# Patient Record
Sex: Male | Born: 1960 | Race: Black or African American | Hispanic: No | Smoking: Former smoker
Health system: Southern US, Community
[De-identification: ages and names within clinical notes are randomized; demographics above are authoritative.]

## PROBLEM LIST (undated history)

## (undated) DIAGNOSIS — E079 Disorder of thyroid, unspecified: Secondary | ICD-10-CM

## (undated) DIAGNOSIS — I509 Heart failure, unspecified: Secondary | ICD-10-CM

## (undated) DIAGNOSIS — I10 Essential (primary) hypertension: Secondary | ICD-10-CM

## (undated) HISTORY — PX: PACEMAKER IMPLANT: EP1218

## (undated) HISTORY — PX: CARDIAC DEFIBRILLATOR PLACEMENT: SHX171

---

## 2001-10-12 ENCOUNTER — Emergency Department (HOSPITAL_COMMUNITY): Admission: EM | Admit: 2001-10-12 | Discharge: 2001-10-12 | Payer: Self-pay | Admitting: Emergency Medicine

## 2002-10-22 ENCOUNTER — Emergency Department (HOSPITAL_COMMUNITY): Admission: EM | Admit: 2002-10-22 | Discharge: 2002-10-22 | Payer: Self-pay | Admitting: Emergency Medicine

## 2002-12-27 ENCOUNTER — Inpatient Hospital Stay (HOSPITAL_COMMUNITY): Admission: EM | Admit: 2002-12-27 | Discharge: 2002-12-29 | Payer: Self-pay | Admitting: *Deleted

## 2002-12-27 ENCOUNTER — Encounter: Payer: Self-pay | Admitting: Emergency Medicine

## 2002-12-28 ENCOUNTER — Encounter: Payer: Self-pay | Admitting: Internal Medicine

## 2002-12-28 ENCOUNTER — Encounter (INDEPENDENT_AMBULATORY_CARE_PROVIDER_SITE_OTHER): Payer: Self-pay | Admitting: Cardiology

## 2002-12-29 ENCOUNTER — Encounter: Payer: Self-pay | Admitting: Internal Medicine

## 2003-01-04 ENCOUNTER — Encounter: Admission: RE | Admit: 2003-01-04 | Discharge: 2003-01-04 | Payer: Self-pay | Admitting: Internal Medicine

## 2003-01-13 ENCOUNTER — Encounter: Admission: RE | Admit: 2003-01-13 | Discharge: 2003-01-13 | Payer: Self-pay | Admitting: Internal Medicine

## 2003-01-17 ENCOUNTER — Encounter: Admission: RE | Admit: 2003-01-17 | Discharge: 2003-01-17 | Payer: Self-pay | Admitting: Internal Medicine

## 2003-02-09 ENCOUNTER — Encounter: Payer: Self-pay | Admitting: Emergency Medicine

## 2003-02-09 ENCOUNTER — Emergency Department (HOSPITAL_COMMUNITY): Admission: AD | Admit: 2003-02-09 | Discharge: 2003-02-09 | Payer: Self-pay | Admitting: Emergency Medicine

## 2003-02-16 ENCOUNTER — Encounter: Admission: RE | Admit: 2003-02-16 | Discharge: 2003-02-16 | Payer: Self-pay | Admitting: Internal Medicine

## 2003-03-15 ENCOUNTER — Encounter: Admission: RE | Admit: 2003-03-15 | Discharge: 2003-03-15 | Payer: Self-pay | Admitting: Internal Medicine

## 2003-04-01 ENCOUNTER — Encounter: Admission: RE | Admit: 2003-04-01 | Discharge: 2003-04-01 | Payer: Self-pay | Admitting: Internal Medicine

## 2003-04-10 ENCOUNTER — Encounter: Payer: Self-pay | Admitting: Emergency Medicine

## 2003-04-10 ENCOUNTER — Emergency Department (HOSPITAL_COMMUNITY): Admission: EM | Admit: 2003-04-10 | Discharge: 2003-04-10 | Payer: Self-pay | Admitting: Emergency Medicine

## 2003-04-12 ENCOUNTER — Encounter: Admission: RE | Admit: 2003-04-12 | Discharge: 2003-04-12 | Payer: Self-pay | Admitting: Internal Medicine

## 2003-05-06 ENCOUNTER — Encounter: Admission: RE | Admit: 2003-05-06 | Discharge: 2003-05-06 | Payer: Self-pay | Admitting: Internal Medicine

## 2003-06-29 ENCOUNTER — Encounter: Admission: RE | Admit: 2003-06-29 | Discharge: 2003-06-29 | Payer: Self-pay | Admitting: Internal Medicine

## 2003-06-29 ENCOUNTER — Ambulatory Visit (HOSPITAL_COMMUNITY): Admission: RE | Admit: 2003-06-29 | Discharge: 2003-06-29 | Payer: Self-pay | Admitting: Internal Medicine

## 2003-07-06 ENCOUNTER — Encounter: Admission: RE | Admit: 2003-07-06 | Discharge: 2003-07-06 | Payer: Self-pay | Admitting: Internal Medicine

## 2003-08-09 ENCOUNTER — Encounter: Admission: RE | Admit: 2003-08-09 | Discharge: 2003-08-09 | Payer: Self-pay | Admitting: Internal Medicine

## 2003-09-22 ENCOUNTER — Encounter: Admission: RE | Admit: 2003-09-22 | Discharge: 2003-09-22 | Payer: Self-pay | Admitting: Internal Medicine

## 2003-09-22 ENCOUNTER — Ambulatory Visit (HOSPITAL_COMMUNITY): Admission: RE | Admit: 2003-09-22 | Discharge: 2003-09-22 | Payer: Self-pay | Admitting: Internal Medicine

## 2003-10-19 ENCOUNTER — Encounter: Admission: RE | Admit: 2003-10-19 | Discharge: 2003-10-19 | Payer: Self-pay | Admitting: Internal Medicine

## 2003-10-25 ENCOUNTER — Encounter: Admission: RE | Admit: 2003-10-25 | Discharge: 2003-10-25 | Payer: Self-pay | Admitting: Internal Medicine

## 2003-12-29 ENCOUNTER — Encounter: Admission: RE | Admit: 2003-12-29 | Discharge: 2003-12-29 | Payer: Self-pay | Admitting: Internal Medicine

## 2004-03-27 ENCOUNTER — Emergency Department (HOSPITAL_COMMUNITY): Admission: EM | Admit: 2004-03-27 | Discharge: 2004-03-27 | Payer: Self-pay | Admitting: Emergency Medicine

## 2004-03-30 ENCOUNTER — Encounter: Admission: RE | Admit: 2004-03-30 | Discharge: 2004-03-30 | Payer: Self-pay | Admitting: Internal Medicine

## 2004-07-13 ENCOUNTER — Emergency Department (HOSPITAL_COMMUNITY): Admission: EM | Admit: 2004-07-13 | Discharge: 2004-07-13 | Payer: Self-pay | Admitting: Emergency Medicine

## 2004-08-01 ENCOUNTER — Ambulatory Visit: Payer: Self-pay | Admitting: Internal Medicine

## 2004-08-02 ENCOUNTER — Ambulatory Visit (HOSPITAL_COMMUNITY): Admission: RE | Admit: 2004-08-02 | Discharge: 2004-08-02 | Payer: Self-pay | Admitting: Internal Medicine

## 2004-08-03 ENCOUNTER — Ambulatory Visit (HOSPITAL_COMMUNITY): Admission: RE | Admit: 2004-08-03 | Discharge: 2004-08-03 | Payer: Self-pay | Admitting: Internal Medicine

## 2004-08-03 ENCOUNTER — Ambulatory Visit: Payer: Self-pay | Admitting: Internal Medicine

## 2004-11-06 ENCOUNTER — Inpatient Hospital Stay (HOSPITAL_COMMUNITY): Admission: EM | Admit: 2004-11-06 | Discharge: 2004-11-13 | Payer: Self-pay | Admitting: Emergency Medicine

## 2004-11-06 ENCOUNTER — Ambulatory Visit: Payer: Self-pay | Admitting: Internal Medicine

## 2004-11-06 ENCOUNTER — Ambulatory Visit: Payer: Self-pay | Admitting: Cardiology

## 2004-11-09 ENCOUNTER — Encounter: Payer: Self-pay | Admitting: Internal Medicine

## 2004-11-20 ENCOUNTER — Ambulatory Visit: Payer: Self-pay | Admitting: Cardiology

## 2004-12-12 ENCOUNTER — Ambulatory Visit: Payer: Self-pay | Admitting: Internal Medicine

## 2004-12-18 ENCOUNTER — Ambulatory Visit: Payer: Self-pay | Admitting: Internal Medicine

## 2005-02-07 ENCOUNTER — Emergency Department (HOSPITAL_COMMUNITY): Admission: EM | Admit: 2005-02-07 | Discharge: 2005-02-07 | Payer: Self-pay | Admitting: Emergency Medicine

## 2005-02-21 ENCOUNTER — Emergency Department (HOSPITAL_COMMUNITY): Admission: EM | Admit: 2005-02-21 | Discharge: 2005-02-21 | Payer: Self-pay | Admitting: Emergency Medicine

## 2005-04-01 ENCOUNTER — Emergency Department (HOSPITAL_COMMUNITY): Admission: EM | Admit: 2005-04-01 | Discharge: 2005-04-01 | Payer: Self-pay | Admitting: *Deleted

## 2005-07-24 ENCOUNTER — Emergency Department (HOSPITAL_COMMUNITY): Admission: EM | Admit: 2005-07-24 | Discharge: 2005-07-24 | Payer: Self-pay | Admitting: Emergency Medicine

## 2005-07-31 ENCOUNTER — Emergency Department (HOSPITAL_COMMUNITY): Admission: EM | Admit: 2005-07-31 | Discharge: 2005-07-31 | Payer: Self-pay | Admitting: Emergency Medicine

## 2005-07-31 ENCOUNTER — Ambulatory Visit: Payer: Self-pay | Admitting: Cardiology

## 2005-08-02 ENCOUNTER — Ambulatory Visit: Payer: Self-pay | Admitting: Cardiology

## 2005-08-23 ENCOUNTER — Ambulatory Visit: Payer: Self-pay | Admitting: Cardiology

## 2005-09-17 ENCOUNTER — Ambulatory Visit: Payer: Self-pay | Admitting: Cardiology

## 2005-10-03 ENCOUNTER — Ambulatory Visit: Payer: Self-pay | Admitting: Internal Medicine

## 2005-10-10 ENCOUNTER — Ambulatory Visit (HOSPITAL_COMMUNITY): Admission: RE | Admit: 2005-10-10 | Discharge: 2005-10-10 | Payer: Self-pay | Admitting: Internal Medicine

## 2005-10-10 ENCOUNTER — Ambulatory Visit: Payer: Self-pay | Admitting: Internal Medicine

## 2005-11-07 ENCOUNTER — Emergency Department (HOSPITAL_COMMUNITY): Admission: EM | Admit: 2005-11-07 | Discharge: 2005-11-07 | Payer: Self-pay | Admitting: Nurse Practitioner

## 2005-12-16 ENCOUNTER — Emergency Department (HOSPITAL_COMMUNITY): Admission: EM | Admit: 2005-12-16 | Discharge: 2005-12-16 | Payer: Self-pay | Admitting: Emergency Medicine

## 2005-12-19 ENCOUNTER — Ambulatory Visit: Payer: Self-pay | Admitting: Internal Medicine

## 2005-12-30 ENCOUNTER — Emergency Department (HOSPITAL_COMMUNITY): Admission: EM | Admit: 2005-12-30 | Discharge: 2005-12-30 | Payer: Self-pay | Admitting: Emergency Medicine

## 2006-05-16 ENCOUNTER — Emergency Department (HOSPITAL_COMMUNITY): Admission: EM | Admit: 2006-05-16 | Discharge: 2006-05-16 | Payer: Self-pay | Admitting: Emergency Medicine

## 2006-06-13 ENCOUNTER — Ambulatory Visit: Payer: Self-pay | Admitting: Family Medicine

## 2006-06-19 ENCOUNTER — Ambulatory Visit: Payer: Self-pay | Admitting: Family Medicine

## 2006-08-08 ENCOUNTER — Emergency Department (HOSPITAL_COMMUNITY): Admission: EM | Admit: 2006-08-08 | Discharge: 2006-08-08 | Payer: Self-pay | Admitting: Family Medicine

## 2006-08-12 DIAGNOSIS — F1010 Alcohol abuse, uncomplicated: Secondary | ICD-10-CM | POA: Insufficient documentation

## 2006-08-12 DIAGNOSIS — I428 Other cardiomyopathies: Secondary | ICD-10-CM | POA: Insufficient documentation

## 2006-08-12 DIAGNOSIS — E0500 Thyrotoxicosis with diffuse goiter without thyrotoxic crisis or storm: Secondary | ICD-10-CM | POA: Insufficient documentation

## 2006-08-27 ENCOUNTER — Ambulatory Visit: Payer: Self-pay | Admitting: Family Medicine

## 2006-09-01 DIAGNOSIS — Z9889 Other specified postprocedural states: Secondary | ICD-10-CM | POA: Insufficient documentation

## 2006-09-01 DIAGNOSIS — K219 Gastro-esophageal reflux disease without esophagitis: Secondary | ICD-10-CM | POA: Insufficient documentation

## 2006-09-01 DIAGNOSIS — I509 Heart failure, unspecified: Secondary | ICD-10-CM | POA: Insufficient documentation

## 2006-09-01 DIAGNOSIS — E038 Other specified hypothyroidism: Secondary | ICD-10-CM | POA: Insufficient documentation

## 2006-10-20 ENCOUNTER — Emergency Department (HOSPITAL_COMMUNITY): Admission: EM | Admit: 2006-10-20 | Discharge: 2006-10-20 | Payer: Self-pay | Admitting: Emergency Medicine

## 2006-10-20 ENCOUNTER — Ambulatory Visit: Payer: Self-pay | Admitting: Cardiovascular Disease

## 2006-10-27 ENCOUNTER — Ambulatory Visit: Payer: Self-pay | Admitting: Family Medicine

## 2007-07-11 ENCOUNTER — Emergency Department (HOSPITAL_COMMUNITY): Admission: EM | Admit: 2007-07-11 | Discharge: 2007-07-11 | Payer: Self-pay | Admitting: Emergency Medicine

## 2008-01-27 ENCOUNTER — Ambulatory Visit: Payer: Self-pay | Admitting: Family Medicine

## 2009-02-06 ENCOUNTER — Telehealth (INDEPENDENT_AMBULATORY_CARE_PROVIDER_SITE_OTHER): Payer: Self-pay | Admitting: *Deleted

## 2009-02-27 ENCOUNTER — Emergency Department (HOSPITAL_COMMUNITY): Admission: EM | Admit: 2009-02-27 | Discharge: 2009-02-28 | Payer: Self-pay | Admitting: Emergency Medicine

## 2009-11-13 IMAGING — CR DG CHEST 2V
2 series · 2 of 2 positions shown · non-contrast
Comparison: none

HISTORY: Chest pain

CHEST 2 VIEWS:
Comparison made to prior study of 10/20/2006
Mild cardiac enlargement.
Tortuous aorta.
Pulmonary vascularity normal.
Minimal chronic bronchitic changes.
No infiltrate, pleural effusion, or pneumothorax.
Bones unremarkable

[view not recorded (1 of 2)]
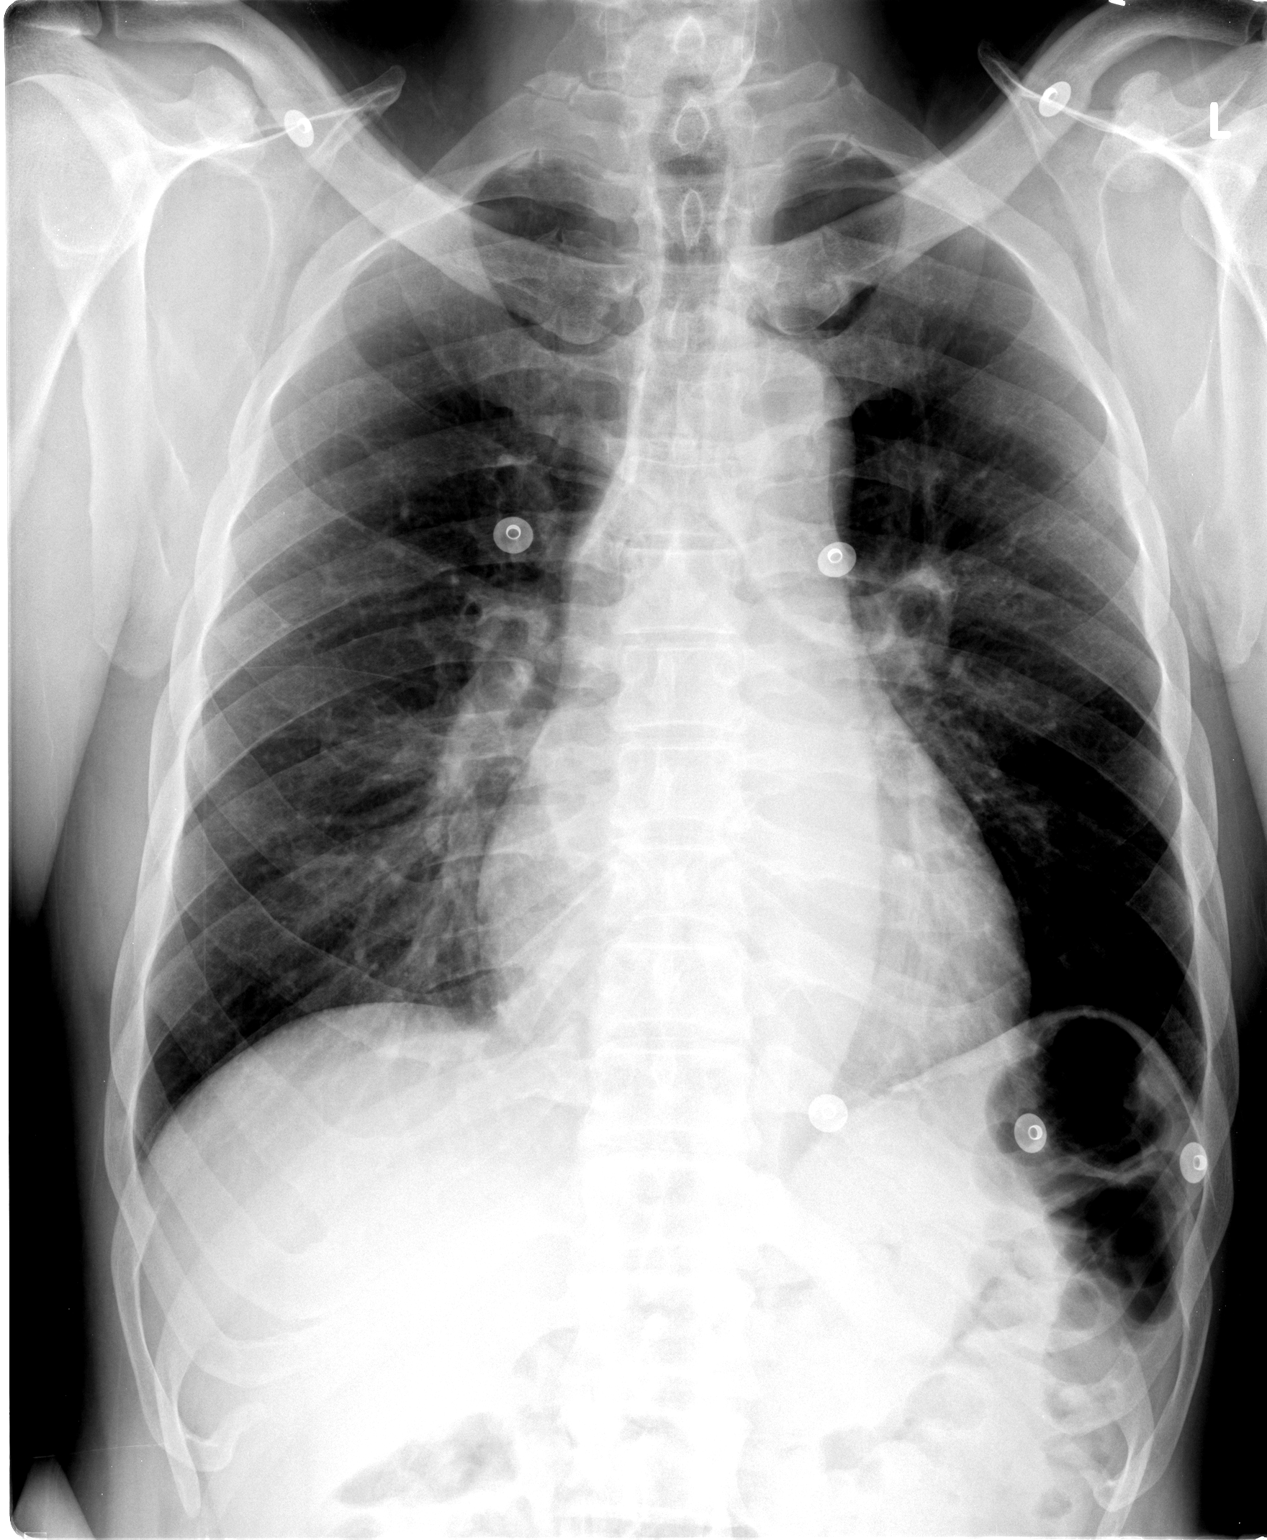

[view not recorded (2 of 2)]
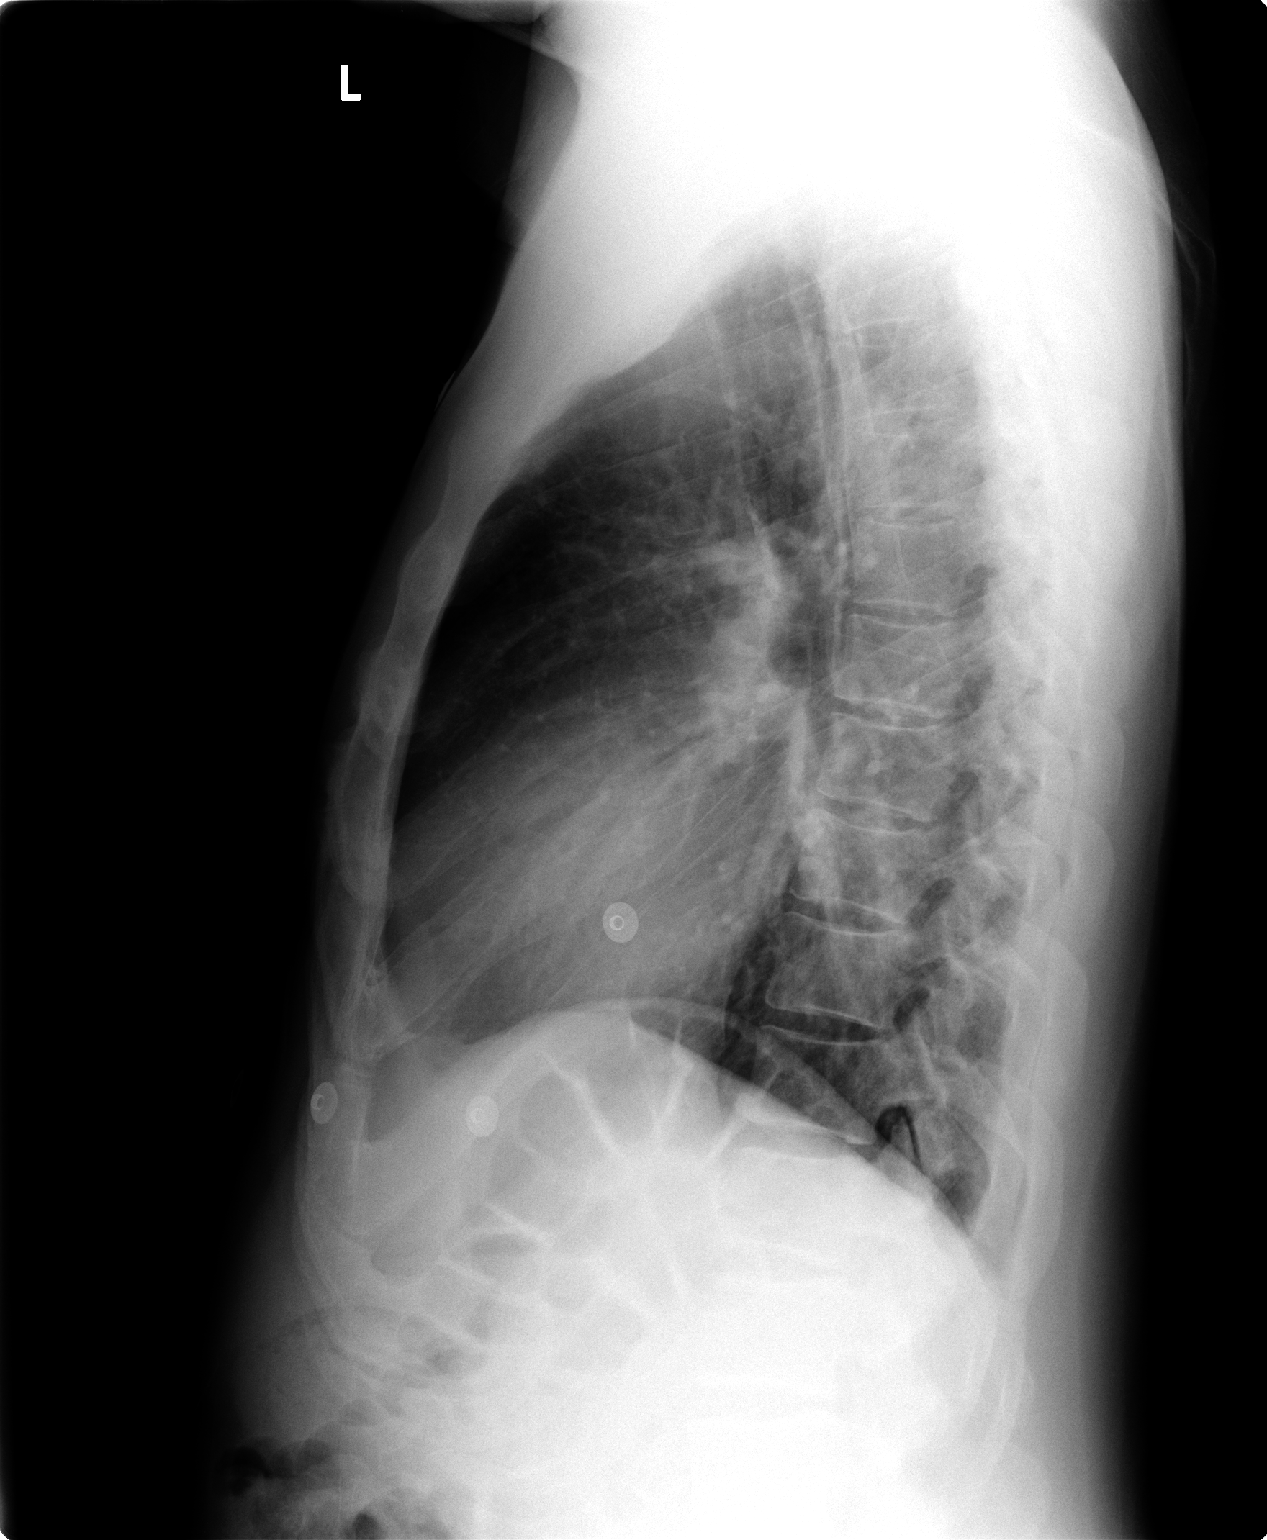

[2 of 2 positions shown; findings below may reference images not displayed]

IMPRESSION: Cardiomegaly with minimal chronic bronchitic changes.
No acute abnormalities.

## 2010-09-16 ENCOUNTER — Encounter: Payer: Self-pay | Admitting: Internal Medicine

## 2010-12-02 LAB — BASIC METABOLIC PANEL
BUN: 11 mg/dL (ref 6–23)
CO2: 25 mEq/L (ref 19–32)
Calcium: 9 mg/dL (ref 8.4–10.5)
Chloride: 103 mEq/L (ref 96–112)
Creatinine, Ser: 1.06 mg/dL (ref 0.4–1.5)
GFR calc Af Amer: >60 mL/min (ref >60)
GFR calc non Af Amer: >60 mL/min (ref >60)
Glucose, Bld: 100 mg/dL — ABNORMAL HIGH (ref 70–99)
Potassium: 4.1 mEq/L (ref 3.5–5.1)
Sodium: 135 mEq/L (ref 135–145)

## 2010-12-02 LAB — POCT CARDIAC MARKERS
CKMB, poc: <1 ng/mL — ABNORMAL LOW (ref 1.0–8.0)
Myoglobin, poc: 53.7 ng/mL (ref 12–200)
Troponin i, poc: <0.05 ng/mL (ref 0.00–0.09)

## 2010-12-02 LAB — CBC
HCT: 42.5 % (ref 39.0–52.0)
Hemoglobin: 13.9 g/dL (ref 13.0–17.0)
MCHC: 32.8 g/dL (ref 30.0–36.0)
MCV: 74.2 fL — ABNORMAL LOW (ref 78.0–100.0)
Platelets: 152 10*3/uL (ref 150–400)
RBC: 5.73 MIL/uL (ref 4.22–5.81)
RDW: 14.2 % (ref 11.5–15.5)
WBC: 4.7 10*3/uL (ref 4.0–10.5)

## 2010-12-02 LAB — DIFFERENTIAL
Basophils Absolute: 0 10*3/uL (ref 0.0–0.1)
Basophils Relative: 1 % (ref 0–1)
Eosinophils Absolute: 0.1 10*3/uL (ref 0.0–0.7)
Eosinophils Relative: 3 % (ref 0–5)
Lymphocytes Relative: 21 % (ref 12–46)
Lymphs Abs: 1 10*3/uL (ref 0.7–4.0)
Monocytes Absolute: 0.3 10*3/uL (ref 0.1–1.0)
Monocytes Relative: 7 % (ref 3–12)
Neutro Abs: 3.2 10*3/uL (ref 1.7–7.7)
Neutrophils Relative %: 68 % (ref 43–77)

## 2011-01-11 NOTE — Discharge Summary (Signed)
[name], [name]                             ACCOUNT NO.:  [id number]   MEDICAL RECORD NO.:  [id number]                   PATIENT TYPE:  INP   LOCATION:  3734                                 FACILITY:  [facility]   PHYSICIAN:  [name] [name], M.D.                  DATE OF BIRTH:  Dec 20, 1960   DATE OF ADMISSION:  12/27/2002  DATE OF DISCHARGE:  12/29/2002                                 DISCHARGE SUMMARY   DISCHARGE DIAGNOSES:  1. 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  2. Question hyperthyroidism.  3. Microcytosis.   DISCHARGE MEDICATIONS:  1. Aspirin 325 mg p.o. daily.  2. Atenolol 50 mg p.o. daily.   FOLLOW UP:  The patient is to follow up at [facility] on Thursday,  Dec 30, 2002, at 11 o'clock for an RAI/uptake scan to evaluate his thyroid.  He is also to follow up with Oretha Ellis, M.D. on Tuesday, May 11, at 3  p.m. at [facility] The phone number is [phone].  The  patient is given all of this information.   HISTORY OF PRESENT ILLNESS:  The patient is a 50 year old male with a  questionable history of hyperthyroidism, who presents with a two-week  history of cough, worse when lying down at night. He feels as though the  cough came on with cold-like symptoms approximately two weeks ago. Since  then the cold had improved, but the cough remained. He reports that he is  coughing up a brownish-yellowish sputum. He has positive sore throat, no  congestion, and no eye problems.  He says that his mouth remains dry and no  fevers. He is short of breath at night when he starts coughing.  The patient  reports that he is able to go about his daily activity without feeling short  of breath. Denies sick contacts, denies TB exposure.  He has a hoarse voice.   PAST MEDICAL HISTORY:  Question diagnosis of hyperthyroidism.  Hyperlipidemia.  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   PHYSICAL EXAMINATION:  VITAL SIGNS:  Pulse 83, blood  pressure 131/87,  temperature 98.0, respirations 20, 98% on room air.  GENERAL:  No acute distress, intermittent cough.  HEENT:  Oropharynx erythematous with cobblestone appearance.  NECK:  No thyromegaly, mild lymphadenopathy bilaterally.  LUNGS:  Respirations clear to auscultation bilaterally.  Fair inspiratory  effort. No crackles.  HEART:  Regular rate and rhythm.  No murmurs, no rubs, S3.  Good pulses.  ABDOMEN:  Bowel sounds positive.  Soft, nontender, and nondistended.  SKIN: No rashes.  EXTREMITIES:  No tremor.  NEUROLOGY:  Cranial nerves II-XII grossly intact.  2+ patellar reflexes.   Admission chest x-ray showed mildly enlarged heart, peribronchial  thickening, no focal air space opacities or effusions.  Impression;  bronchitic changes.   EKG; normal sinus rhythm, biatrial enlargement, left ventricular  hypertrophy.  There was an inverted T wave in V4 and V5.   LABORATORY DATA:  Admission BNP 276.  Admission CBC; hemoglobin 13.9, white  blood cell count 6.6, 12% eosinophils, MCV 69.7.   HOSPITAL COURSE:  Problem 1.  Cardiomegaly. The patient underwent an  echocardiogram that showed ejection fraction 10 to 20%. Echo pattern  consistent with dilated cardiomyopathy. No diagnostic evidence of regional  wall abnormalities.  Left atrium was moderately dilated.  Right ventricle  was mild to moderately dilated.  This cardiomyopathy is not likely ischemic  as the patient ruled out by cardiac enzymes. Apparently, the patient has had  a workup for cardiomyopathy over a year ago in [facility]  Records of this are being sent to [facility].  Most likely  etiology includes hyperthyroidism. The patient has a history of  hyperthyroidism.  Apparently, was told that he needed to be treated with  radiation, but did not undergo the treatment.  His mother was hypothyroid by  history. The patient reports that he generally runs hot.  His TSH was  0.009.  Free T4  is pending.  The patient was started on a beta blocker and  is being sent out on such. Other possible etiologies include viral including  HIV and HIV test is pending. The patient will need to get his thyroid under  control and have close outpatient management of his heart failure.   Problem 2.  Hyperthyroidism.  The patient had a presumed diagnosis of  hyperthyroidism by history and lab, however, the patient said that he may  have been off his Synthroid at one point, hence the question of the actual  diagnosis.  Free T4 is pending. The patient is scheduled for an RAI and  uptake in the morning. We will follow up his labs and treat him  appropriately when the official diagnosis has been made.   Problem 3.  Cough.  This could be related to allergies, the patient having  eosinophilia, or to asthma or perhaps to a mild degree of congestive heart  failure.  It has resolved during this hospitalization.  We will continue to  monitor this in the outpatient setting.   Problem 4.  Microcytosis.  The patient is not anemic.  His discharge  hemoglobin is 14.5, however, his MCV is 69.3.   FOLLOW UP:  The patient is to follow up with radiology as mentioned above  and with Oretha Ellis, M.D. at [facility] on Tuesday,  May 11, at which point we will need to make sure that he is on correct  medications for his cardiomyopathy, begin appropriate treatment depending on  the final results of his free T4 and his RAI/uptake and continued general  health maintenance.     Oretha Ellis, M.D.                      [name] [name], M.D.    BT/MEDQ  D:  12/29/2002  T:  12/31/2002  Job:  [id number]   cc:   [facility]

## 2011-01-11 NOTE — Consult Note (Signed)
[name], [name]                 ACCOUNT NO.:  [id number]   MEDICAL RECORD NO.:  [id number]          PATIENT TYPE:  INP   LOCATION:  4703                         FACILITY:  [facility]   PHYSICIAN:  Jonelle Sidle, M.D. LHCDATE OF BIRTH:  [birth year]   DATE OF CONSULTATION:  11/07/2004  DATE OF DISCHARGE:                                   CONSULTATION   REASON FOR CONSULTATION:  Chest pain and cardiomyopathy.   HISTORY OF PRESENT ILLNESS:  [name] [name] is a 50 year old male with a history  of hypothyroidism, medication noncompliance, prior and current substance  abuse, possible dyslipidemia, and hypertension, and a cardiomyopathy with an  ejection fraction in the range of 10% to 20%, associated with left  ventricular enlargement as well as right ventricular enlargement and  dysfunction by echocardiogram in May of 2004.  The presumption is that this  was felt to be nonischemic, although I am not certain that the patient has  ever undergone any specific ischemic testing.  He tells me that he had  several heart tests done at [facility] in [place] a few years ago.  There is also some mention of this in a previous discharge summary, although  I see no specifics at this time.  In any event, he is now admitted to the  hospital with a 2-3 day history of waxing and waning chest discomfort with  some numbness in his left arm and hand.  His cardiac markers during  observation have been normal, and his electrocardiogram does show inferior  and anterolateral ST-T wave changes, specifically with T wave inversions  that look to be more prominent compared to previous tracing from December of  2005.  Today, on interview, he is chest pain free.  Plans were originally to  proceed with a Cardiolite study for the assessment of ischemia.  We have  been asked to offer our opinion in this regard.   ALLERGIES:  No reported drug allergies.   MEDICATIONS:  At home, the patient was apparently to be taking  -  1.  Lasix 40 mg p.o. daily.  2.  Lisinopril 20 mg p.o. daily.  3.  Some dose of Synthroid.  (He tells me that he has not been taking Synthroid for the last few months,  and has been inconsistent with the other medications.)   PAST MEDICAL HISTORY:  1.  Dilated cardiomyopathy with an ejection fraction in the 10% to 20%      range.  This is described as being potentially non-ischemic, although I      am not certain about previous ischemic testing.  He is not aware of any      diagnosis of coronary artery disease or myocardial infarction.  2.  Possible dyslipidemia and hypertension.  3.  Hypothyroidism.  He has not been consistent with thyroid replacement      medicines.  His present TSH is 14.  4.  History of nephrolithiasis.  5.  Status post previous hemorrhoidectomy.  6.  Apparent problems with gastroparesis.   SOCIAL HISTORY:  The patient is separated.  He reports a  history of  marijuana use recently.  He has a remote history of cocaine use, although  has apparently abstained for the last 6 years, and was in rehabilitation at  one point.  He drinks 2 beers a day.  At present, he is out of work.   FAMILY HISTORY:  Significant for hypertension, type 2 diabetes mellitus, and  coronary artery disease.   REVIEW OF SYSTEMS:  As described in the history of present illness.  He has  had some problems with nausea after meals.  He has had some reported cold  intolerance, back pain, dizziness, depressed mood, and chest pain as  described.  He otherwise denies having any active orthopnea, progressive  shortness of breath with exertion, lower extremity edema, palpitations, and  syncope.   PHYSICAL EXAMINATION:  VITAL SIGNS:  Temperature is 97.3 degrees, heart rate  55 and sinus rhythm, blood pressure 104/77, oxygen saturation 100% on room  air.  GENERAL:  This is a thin male lying supine, in no acute respiratory  distress.  NECK:  No elevated jugular venous pressure or loud carotid  bruits.  LUNGS:  Essentially clear to auscultation with somewhat decreased breath  sounds at the bases.  CARDIAC:  Regular rate and rhythm with a soft S4 and apical systolic murmur.  No pericardial rub is noted.  ABDOMEN:  Soft and nondistended.  EXTREMITIES:  No pitting edema.  Peripheral pulses are 2+.   LABORATORY DATA:  Hemoglobin 14.5, hematocrit 44.7, platelets 144, WBC's  4.5.  Potassium 3.6, BUN 11, creatinine 1.2, glucose 124.  TSH is 14.  Troponin I levels are 0.01 times 3.  BNP is less than 30.  Drug screen is  positive for tetrahydrocannabinol.   Chest x-ray from November 06, 2004 is reported as showing cardiomegaly with no  active disease.   IMPRESSION:  1.  Dilated cardiomyopathy with biventricular dysfunction by echocardiogram      in May of 2004.  The presumption is a nonischemic cardiomyopathy,      although I am not certain that this has been objectively documented.      The patient apparently had some type of cardiac evaluation at [facility] in [place] a few years ago.  He tells me that this may have      included cardiac catheterization based on his description.  At this      point, he has presented with chest discomfort with somewhat atypical      features, and has ruled out for myocardial infarction.  He is not      particularly volume overloaded at this time, and there is a repeat      echocardiogram pending for reassessment of left ventricular function.      He has been tentatively scheduled for a Cardiolite by his primary team.  2.  History of polysubstance abuse and medication noncompliance, as      outlined.  3.  History of hypothyroidism with inconsistent use of Synthroid.  4.  Possible history of hypertension and dyslipidemia.   RECOMMENDATIONS:  1.  Would follow up on echocardiogram, and, if possible, try to obtain      previous cardiac records from [facility] in [place]. 2.  Agree with ACE inhibitor and diuretic for the time being.   Perhaps      ultimately a low-dose beta blocker could be added, particularly if his      heart rate improves somewhat with resumption of therapy for  hypothyroidism.  An aspirin would also not be unreasonable until the      issue of ischemia is clarified.  3.  In terms of further diagnostic cardiac testing, it may actually be best      to consider a right and left heart catheterization for both hemodynamic      assessment and clear definition of his coronary anatomy, particularly if      this has not been already assessed in the past.  If, in fact, he has      already undergone cardiac catheterization demonstrating normal coronary      arteries, then a Cardiolite would be reasonable at this time.  4.  Obviously of greater importance will be compliance and follow up in      terms of this patient's prognosis.  Would      anticipate stabilizing him on medical therapy, and if, in fact, he has      not undergone cardiac catheterization in the past, anticipate this at      some point during this hospital stay.   Will plan to follow with you.      SGM/MEDQ  D:  11/07/2004  T:  11/07/2004  Job:  [id number]

## 2011-01-11 NOTE — Discharge Summary (Signed)
[name], [name] [name]                 ACCOUNT NO.:  [id number]   MEDICAL RECORD NO.:  [id number]          PATIENT TYPE:  INP   LOCATION:  4703                         FACILITY:  [facility]   PHYSICIAN:  Donald Pore, MD       DATE OF BIRTH:  September 08, 1960   DATE OF ADMISSION:  11/06/2004  DATE OF DISCHARGE:                                 DISCHARGE SUMMARY   DISCHARGE DIAGNOSES:  1.  Nonischemic cardiomyopathy.  2.  Hypothyroidism status post radioactive ablation.  3.  History of nephrolithiasis.  4.  Status post previous hemorrhoidectomy.  5.  Hypertension.  6.  Gastroparesis.   DISCHARGE MEDICATIONS:  1.  Lasix 20 mg p.o. daily.  2.  Lisinopril 10 mg p.o. daily.  3.  Protonix 40 mg p.o. daily.  4.  Reglan 5 mg p.o. daily before meal in evening.  5.  Nitroglycerin 0.4 mg sublingual q.5 minutes for a maximum three doses      p.r.n. chest pain.  6.  Thiamine 100 mg p.o. daily.  7.  Folate 1 mg p.o. daily.  8.  Synthroid 100 mcg p.o. daily.  9.  Ecotrin 325 mg p.o. daily.   DISCHARGE INSTRUCTIONS:  No driving x2 days.  No lifting over 10 pounds x1  week.  Clean catheterization site with soap and water.  No tub bathing or  submerging x1 week.   FOLLOW-UP APPOINTMENTS:  Hospital follow-up appointment with Dr. Donald Pore at the [facility] on November 28, 2004 at 4 p.m.  Hospital follow-up appointment at the Heart Failure Clinic at [facility] on Tuesday, March 28 at 11 a.m.  Follow-up appointment with Dr.  Diona Browner on April 11 at 9 a.m. at [facility].   CONSULTS:  1.  Dr. Nona Dell  2.  Dr. Keitha Butte  3.  Dr. Duncan Dull   STUDIES:  1.  The patient underwent a 2-D echocardiogram on November 09, 2004 showing a      left ventricular ejection fraction of 25% with diffuse global      hypokinesis, moderately dilated left ventricles, right ventricular      systolic function mildly reduced.  2.  Cardiac catheterization was performed on November 13, 2004.  Showed      nonischemic dilated cardiomyopathy with normal coronary arteries, no      significant coronary artery disease.  3.  On November 08, 2004 patient underwent a Cardiolite showing an ejection      fraction of 29% with a probable inferior infarct with mild ischemia in      the inferior apical segment and marked left ventricular enlargement,      global hypokinesis most prominent in the inferior wall consistent with a      cardiomyopathy.   HISTORY OF PRESENT ILLNESS:  This is a 50 year old male who reported to the  emergency room complaining of a two-day history of left-sided chest pain  worsening with exertion (patient stated chest pain acutely worsened while  walking [place] the cour[place]).  Patient also complained of left  hand numbness.  He denied chills, cough, hemoptysis, fever.  On admission  the patient reported that he had not taken his Synthroid for the past three  months.  He thought that he had been taking it, but later realized that he  did not have the medication on-hand.   HOSPITAL COURSE:  #1 - CHEST PAIN:  Complete work-up for his chest pain was  performed and patient was ruled out for an acute coronary syndrome.  Serial  cardiac enzymes were negative.  No pneumonia or aortic dissection was  visualized on chest x-ray.  D-dimer was less than 0.22 so it was felt  unlikely that patient was experiencing symptoms related to a PE.  2-D  echocardiogram was performed with the aforementioned results.  In light of  the results of his Cardiolite and without having any records from his prior  work-up in [place], we felt that it was in the patient's best interest  that ischemic causes for his cardiomyopathy be ruled out so he underwent a  cardiac catheterization with the aforementioned results.  Patient tolerated  the procedure well and on the day of discharge was stable without  complications.  He is to follow up at the [facility] Failure Clinic and also at   [facility] as previously dictated.   #2 - HYPOTHYROIDISM:  On admission Mr. [name] [name]' TSH was 14.04.  He was  restarted on his Synthroid at 100 mcg daily.  TSH will need to be rechecked  on outpatient basis.  This can be done at his hospital follow-up appointment  at the [facility].   #3 - HYPERTENSION:  Mr. [name] [name]' blood pressures were well controlled  throughout his stay.  In fact, he did experience some episodes of systolic  blood pressures in the 90s.  Thus, his Lasix dose was decreased from 40 mg  p.o. daily to 20 mg p.o. daily upon discharge.   #4 - GASTROPARESIS:  Patient complained of symptoms of nausea following  meals.  Patient was started on Protonix and Reglan during his  hospitalization with some improvement in his symptoms.  He will need to be  followed by his primary care physician to see if his symptoms have improved  or if any adjustment in medication needs to be made.   #5 - ALCOHOL USE:  Patient was started on vitamin B and folic acid.  He  drinks two beers per day.   The patient was discharged to home in stable condition with the  aforementioned follow-up appointments.  On the day of discharge his BMET was  as follows:  Sodium 132, potassium 4.1, chloride 97, CO2 27, BUN 12,  creatinine 1.1, calcium 9.3, glucose 88.  CBC was as follows:  White count  6.6, hemoglobin 14.4, hematocrit 44.5, and platelet count of 207.     HP/MEDQ  D:  11/13/2004  T:  11/13/2004  Job:  [id number]   cc:   Jonelle Sidle, M.D. [facility]   Keitha Butte, MD  Fax: [phone]

## 2011-01-11 NOTE — Discharge Summary (Signed)
[name] [name], [name] [name]                 ACCOUNT NO.:  [id number]   MEDICAL RECORD NO.:  [id number]          PATIENT TYPE:  EMS   LOCATION:  MAJO                         FACILITY:  [facility]   PHYSICIAN:  Rollene Rotunda, M.D.   DATE OF BIRTH:  [birth year]   DATE OF ADMISSION:  07/31/2005  DATE OF DISCHARGE:  07/31/2005                                 DISCHARGE SUMMARY   AMA DISCHARGE.   HISTORY OF PRESENT ILLNESS:  [name] [name] admitted, see H and P for details.  Currently pain-free.  He states that he has changed his mind and he does not  want to stay in hospital overnight for observation and that he is going to  just leave the hospital and keep his appointment, which he has at our  office.  I called to clarify appointment with physician extender on Friday,  August 02, 2005 at 4:00 P.M.  [name] [name] states that his chest pain is  completely gone.   LABORATORY DATA:  At this time the point of care is negative X2.  Drug  screen negative.  ETOH less than 5.  Other laboratory work as stated in  history and physical.   DISCUSSION:  I spent some time talking with [name] [name] about his  hypertension and the effects on his heart and the need for medical  compliance.  [name] [name] states he is going to try to do better and take his  medication as prescribed and assures me he will follow up with his  appointment on Friday.  I have given him a prescription for his Lisinopril  and Lasix as he has none left at home and I do not want him to go without.   Duration of discharge AMA encounter 20 minutes.      Dorian Pod, NP    ______________________________  Rollene Rotunda, M.D.    MB/MEDQ  D:  07/31/2005  T:  07/31/2005  Job:  [id number]   cc:   Rollene Rotunda, M.D.  [address]  [address]  [place]  [place] [address]   [facility]

## 2011-01-11 NOTE — Consult Note (Signed)
[name] [name], [name] [name]                 ACCOUNT NO.:  [id number]   MEDICAL RECORD NO.:  [id number]          PATIENT TYPE:  EMS   LOCATION:  MAJO                         FACILITY:  [facility]   PHYSICIAN:  Veverly Fells. Excell Seltzer, MD  DATE OF BIRTH:  March 30, 1961   DATE OF CONSULTATION:  10/20/2006  DATE OF DISCHARGE:                                 CONSULTATION   PRIMARY CARDIOLOGIST:  Rollene Rotunda, MD, [facility], [place] and Dorian Pod,  ACNP.   PRIMARY CARE PHYSICIAN:  Dr. Susann Givens at [facility]  through [place].   HISTORY OF PRESENT ILLNESS:  This is a 50 year old African-American male  with complaints of left-sided chest pain x1 which got worse yesterday on  October 19, 2006.  He describes it as chest pressure, feelings of  weakness when lying down.  The pain was continuous and constant.  He  could not eat secondary to vomiting.  He did not sleep well as a result  of the pressure.  He states when he lies on his left side the pain  becomes worse, so he lies on his right side, and it does become better.  He denies any PND, orthopnea.  He denies any shortness of breath or  edema.   The patient has been seen by Dr. Daiva Nakayama and Dorian Pod, ACNP,  in the past through the [facility] Failure [facility], but he has not been to the  Heart Failure Clinic in over a year.  The patient does see outpatient  clinic physicians through [facility], however, at a family health  clinic.  The patient currently is without discomfort and is able to lie  flat on his evaluation.   PAST MEDICAL HISTORY:  1. 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  2. Hypothyroidism.  3. Polysubstance abuse.  4. Hypertension.  5. Gastroparesis.   SOCIAL HISTORY:  The patient lives in [place] with his girlfriend.  He is a Psychologist, occupational and a Psychologist, occupational.  He denies tobacco.  He does drink one  beer once a day and whiskey on occasion.  He does smoke marijuana about  every other day.  He has used  cocaine in the past.  He is not on any  special diet.  He does not use herbal medications.   FAMILY HISTORY:  Mother died of a myocardial infarction at age 57.  Father is alive, and he has unknown health problems.  He has one brother  with hypertension.   CURRENT MEDICATIONS AT HOME:  1. Synthroid 1 mg once a day.  2. Lasix 20 mg once a day.  3. Metoclopramide 5 mg with meals.   ALLERGIES:  No known drug allergies.   CURRENT LABORATORY DATA:  Sodium 138, potassium 4.0, chloride 104, CO2  12, BUN 1.2, glucose 96, hemoglobin 17.7, hematocrit 52.0, white blood  cells 4.1, platelets 175.  Troponin 0.05, CK-MB 1.0, CK 75.   PHYSICAL EXAMINATION:  VITAL SIGNS:  Blood pressure 135/85, pulse 50,  respirations 18, temperature 98.5, O2 saturation 97% on room air.  HEENT:  Head is normocephalic and atraumatic.  EYES:  PERRLA.  Mucous  membranes of the mouth pink and moist.  Tongue is midline.  NECK:  Supple without JVD, no carotid bruits are appreciated.  CARDIOVASCULAR:  Regular rate and rhythm with a 2/6 apical systolic  murmur auscultated.  LUNGS:  Essentially clear to auscultation.  ABDOMEN:  Soft, nontender, 2+ bowel sounds, no abdominal bruits are  appreciated, no ballotment, and there is no tenderness noted.  EXTREMITIES:  Without clubbing, cyanosis, or edema.  Dorsalis pedis  pulses 1+ bilaterally.  SKIN:  Warm and dry.  NEUROLOGIC:  Intact.   EKG revealing sinus bradycardia with LVF, repolarization changes noted.   IMPRESSION:  1. Nonischemic cardiomyopathy with an ejection fraction of 20 to 25%.  2. Chest discomfort.  3. Hypothyroidism.  4. History of hypertension.  5. History of gastroparesis.  6. History of poly substance abuse.   PLAN:  The patient was seen and examined by myself and Dr. Tonny Bollman in the emergency room.  He is a 50 year old male with two weeks  of atypical left-sided chest pain, worse on the left side in the absence  of dyspnea syncope, and any  other confounding symptoms.  The pain is  better when he is up and active without objective evidence of ischemia.  Normal coronary catheterization in 2006.   Our plan will be to discharge him home, start him on lisinopril 5 mg for  CHF and nonischemic cardiomyopathy.  No beta blocker in the setting of  bradycardia with continued Lasix 20 mg once a day.  He appears well  compensated at this time.  He will follow up with Dr. Antoine Poche and  Dorian Pod, ACNP, through the [facility] on  March 23, at 12:30 p.m.      [name]. [name], NP      Veverly Fells. Excell Seltzer, MD  Electronically Signed    KML/MEDQ  D:  10/20/2006  T:  10/20/2006  Job:  [id number]

## 2011-01-11 NOTE — Op Note (Signed)
NAMEGIORDAN, [name]                 ACCOUNT NO.:  [id number]   MEDICAL RECORD NO.:  [id number]          PATIENT TYPE:  INP   LOCATION:  4703                         FACILITY:  [facility]   PHYSICIAN:  Charlton Haws, M.D.     DATE OF BIRTH:  [birth year]   DATE OF PROCEDURE:  DATE OF DISCHARGE:                                 OPERATIVE REPORT   PROCEDURE:  Coronary angiography.   INDICATIONS FOR PROCEDURE:  Cardiomyopathy, abnormal Cardiolite study,  question ischemic nature.   Cine catheterization was done from the heart, femoral artery and vein. Left  main coronary artery was normal.   Left anterior ascending artery was normal. It was a large artery that  wrapped the apex, first and second diagonal branches were normal.   The circumflex coronary artery was left dominant, circumflex coronary artery  was normal.   The right coronary artery was somewhat small and nondominant and was normal.   The patient had known EF 10-20% both by Cardiolite and by echo. LV gram was  not performed; however, we did cross the valve for hemodynamic measurements.   Right heart catheterization was done to assess filling pressures given his  severe decreasing LV function.  Mean right atrial pressure was 2, RV  pressure was 19/3, PA pressure was 16/2, mean pulmonary capillary wedge  pressure was 6.   The aortic pressure was 104/74, LV pressure was 101/5.   IMPRESSION:  The patient has nonischemic cardiomyopathy. I suspect his Lasix  can be decreased from 40 mg a day to 10-20 mg a day given his low filling  pressures.   There is no evidence of coronary disease, he can be discharged in the  morning as long as his groin heals well.      PN/MEDQ  D:  11/12/2004  T:  11/12/2004  Job:  [id number]   cc:   Duncan Dull, M.D.  Fax: [phone]

## 2011-01-11 NOTE — H&P (Signed)
NAME:  [name] [name], SALADA NO.:  [id number]   MEDICAL RECORD NO.:  [id number]          PATIENT TYPE:  EMS   LOCATION:  MAJO                         FACILITY:  [facility]   PHYSICIAN:  Jonelle Sidle, M.D. LHCDATE OF BIRTH:  1961-05-26   DATE OF ADMISSION:  07/31/2005  DATE OF DISCHARGE:                                HISTORY & PHYSICAL   PRIMARY CARE PHYSICIAN:  [facility]   PRIMARY CARDIOLOGIST:  Dr. Antoine Poche   REASON FOR ADMISSION:  Chest pain.   HISTORY OF PRESENT ILLNESS:  Mr. Chervenak is a 50 year old male with known  dilated non-ischemic cardiomyopathy with an ejection fraction of  approximately 25-30% based on most recent assessment.  He is status post  cardiac catheterization in March of this year revealing normal coronary  arteries.  He was seen in our heart failure clinic following discharge from  the hospital in late March, but has not been compliant with follow-up and  medical therapy since that time.  He states that over the last few months he  has not taken any of his medications on a consistent basis.  Over the last  few weeks he has been experiencing a dull upper chest discomfort that  spreads across his chest and over the last few days has been almost  constant.  He thought that he may have strained some muscles in his chest  doing some lifting at work Firefighter).  He was apparently seen in the  emergency department a week ago, but left against medical advice.  His  electrocardiogram from November 29 showed sinus bradycardia with voltage  criteria for left ventricular hypertrophy with QRS widening and  inferolateral ST-T wave changes likely re-presenting repolarization  abnormalities based on the patient's history.  His follow-up tracing today  shows similar changes.  Initial point of care cardiac markers are normal.   ALLERGIES:  No known drug allergies.   MEDICATIONS:  None taken consistently.  As of his chronic heart failure  clinic note from March the patient was on Synthroid 0.1 mg p.o. daily, folic  acid 1 mg p.o. daily, metoclopramide 5 mg p.o. q.i.d. with meals, Lasix 20  mg p.o. daily, lisinopril 10 mg p.o. daily, enteric-coated aspirin 325 mg  p.o. daily, and vitamin B1 supplements p.o. daily.   PAST MEDICAL HISTORY:  1.  Dilated non-ischemic cardiomyopathy with an ejection fraction by      echocardiogram in March of approximately 25%.  Cardiac catheterization      in March showed normal coronary arteries.  2.  Hypothyroidism, inconsistently on Synthroid.  3.  History of polysubstance use.  4.  Hypertension.  5.  Gastroparesis.   SOCIAL HISTORY:  Patient lives in [place] with his girlfriend.  He is  presently out of work.  He states he drinks one beer each day and  continues to smoke marijuana approximately every three days.  He has  additional prior history of cocaine use.  He does not exercise regularly.   FAMILY HISTORY:  Significant for coronary artery disease and hypertension in  the patient's mother who died  of a myocardial infarction at age 4.   REVIEW OF SYSTEMS:  As described in the history of present illness.  He has  also complained of intermittent headaches.  He is not having any orthopnea,  PND, palpitations, lower extremity edema, or syncope.  Systems otherwise  negative.   PHYSICAL EXAMINATION:  VITAL SIGNS:  Temperature is 97.2 degrees, heart rate  62, respirations 20, blood pressure 127/75, oxygen saturation is 100% on 2 L  nasal cannula.  GENERAL:  This is a thin, normally nourished male in no acute distress.  HEENT:  Conjunctivae and lids are normal with perhaps mild exophthalmus.  Oropharynx is clear.  NECK:  Supple without elevated jugular venous pressure.  No carotid bruits  are noted.  LUNGS:  Clear to auscultation.  CARDIAC:  Regular rate and rhythm without loud S3 gallop or murmur.  There  is no pericardial rub.  ABDOMEN:  Soft with normoactive bowel sounds.  No  bruits are noted.  EXTREMITIES:  No pitting edema.  Peripheral pulses are 2+.  SKIN:  No ulcerative changes are noted.  MUSCULOSKELETAL:  No kyphosis is noted.  NEUROPSYCHIATRIC:  The patient is alert and oriented x3.   Chest x-ray shows cardiomegaly with somewhat increased peribronchial  thickening, but no overt edema.   LABORATORY DATA:  WBC 4, hemoglobin 14, platelets 183.  Sodium 138,  potassium 3.7, BUN 8, creatinine 1.1, glucose 103.  Initial point of care  troponin I levels are less than 0.05.  INR is 1.1.   IMPRESSION:  1.  Chest pain syndrome, doubt ischemic etiology based on history of normal      coronary arteries in March of this year and known dilated      cardiomyopathy.  Initial point of care cardiac markers are normal and      electrocardiogram shows no marked new changes, although he is abnormal      at baseline.  2.  Known dilated non-ischemic cardiomyopathy with an ejection fraction of      approximately 25%.  This is complicated by medication and follow-up      noncompliance as well as history of polysubstance abuse.  3.  Hypothyroidism, inconsistently on Synthroid.  4.  Hypertension.   PLAN:  1.  The patient will be admitted for observation.  Will cycle cardiac      markers and follow telemetry.  2.  Will place back on a basic medical regimen including aspirin,      lisinopril, and Lasix.  Will also likely work towards institution of a      low-dose beta blocker.  3.  Follow up TSH, alcohol level, and urine drug screen.  4.  I stressed the importance of medication and follow-up compliance from      the patient.  We will try to get him scheduled back in our heart failure      clinic and assist with medications as necessary.  5.  Further plans to follow.           ______________________________  Jonelle Sidle, M.D. [facility]     SGM/MEDQ  D:  07/31/2005  T:  07/31/2005  Job:  [id number]

## 2011-06-04 LAB — I-STAT 8, (EC8 V) (CONVERTED LAB)
BUN: 10
Bicarbonate: 26.4 — ABNORMAL HIGH
Glucose, Bld: 76
TCO2: 28
pH, Ven: 7.366 — ABNORMAL HIGH

## 2011-06-04 LAB — CBC
HCT: 42.5
Hemoglobin: 13.8
MCHC: 32.4
MCV: 77.1 — ABNORMAL LOW
Platelets: 225
RBC: 5.51
RDW: 15.1
WBC: 4.6

## 2011-06-04 LAB — HEPATIC FUNCTION PANEL
Bilirubin, Direct: 0.2
Indirect Bilirubin: 1.4 — ABNORMAL HIGH
Total Protein: 7.4

## 2011-06-04 LAB — DIFFERENTIAL
Basophils Absolute: 0
Basophils Relative: 1
Eosinophils Absolute: 0.2
Eosinophils Relative: 4
Lymphocytes Relative: 33

## 2011-06-04 LAB — POCT CARDIAC MARKERS
CKMB, poc: <1 — ABNORMAL LOW
CKMB, poc: <1 — ABNORMAL LOW
Myoglobin, poc: 119
Myoglobin, poc: 64.9
Operator id: 192351
Operator id: 257131

## 2011-06-04 LAB — LIPASE, BLOOD: Lipase: 15

## 2019-07-06 ENCOUNTER — Emergency Department (HOSPITAL_COMMUNITY): Payer: Medicare PPO

## 2019-07-06 ENCOUNTER — Encounter (HOSPITAL_COMMUNITY): Payer: Self-pay

## 2019-07-06 ENCOUNTER — Other Ambulatory Visit: Payer: Self-pay

## 2019-07-06 DIAGNOSIS — R0989 Other specified symptoms and signs involving the circulatory and respiratory systems: Secondary | ICD-10-CM | POA: Insufficient documentation

## 2019-07-06 DIAGNOSIS — Z5321 Procedure and treatment not carried out due to patient leaving prior to being seen by health care provider: Secondary | ICD-10-CM | POA: Insufficient documentation

## 2019-07-06 MED ORDER — LIDOCAINE VISCOUS HCL 2 % MT SOLN: 15.0000 mL | Freq: Once | OROMUCOSAL | Status: DC

## 2019-07-06 NOTE — ED Triage Notes (Signed)
Pt presents to ED after eating chicken tonight at [employer] feels like something is stuck in his throat. Pt denies SOB or difficulty swallowing, just states hurts to swallow.

## 2019-07-07 ENCOUNTER — Emergency Department (HOSPITAL_COMMUNITY)
Admission: EM | Admit: 2019-07-07 | Discharge: 2019-07-07 | Disposition: A | Discharge status: Home / Self Care | Payer: Medicare PPO | Attending: Emergency Medicine | Admitting: Emergency Medicine

## 2019-07-07 HISTORY — DX: Disorder of thyroid, unspecified: E07.9

## 2019-07-07 HISTORY — DX: Heart failure, unspecified: I50.9

## 2019-07-07 HISTORY — DX: Essential (primary) hypertension: I10

## 2020-04-18 ENCOUNTER — Other Ambulatory Visit: Payer: Self-pay

## 2020-04-18 ENCOUNTER — Encounter (HOSPITAL_COMMUNITY): Payer: Self-pay | Admitting: Emergency Medicine

## 2020-04-18 ENCOUNTER — Emergency Department (HOSPITAL_COMMUNITY)
Admission: EM | Admit: 2020-04-18 | Discharge: 2020-04-18 | Disposition: A | Discharge status: Home / Self Care | Payer: Medicare PPO | Attending: Emergency Medicine | Admitting: Emergency Medicine

## 2020-04-18 ENCOUNTER — Emergency Department (HOSPITAL_COMMUNITY): Payer: Medicare PPO

## 2020-04-18 DIAGNOSIS — R0600 Dyspnea, unspecified: Secondary | ICD-10-CM | POA: Insufficient documentation

## 2020-04-18 DIAGNOSIS — Z7982 Long term (current) use of aspirin: Secondary | ICD-10-CM | POA: Insufficient documentation

## 2020-04-18 DIAGNOSIS — E890 Postprocedural hypothyroidism: Secondary | ICD-10-CM | POA: Diagnosis not present

## 2020-04-18 DIAGNOSIS — Z950 Presence of cardiac pacemaker: Secondary | ICD-10-CM | POA: Diagnosis not present

## 2020-04-18 DIAGNOSIS — Z79899 Other long term (current) drug therapy: Secondary | ICD-10-CM | POA: Diagnosis not present

## 2020-04-18 DIAGNOSIS — Z87891 Personal history of nicotine dependence: Secondary | ICD-10-CM | POA: Insufficient documentation

## 2020-04-18 DIAGNOSIS — I110 Hypertensive heart disease with heart failure: Secondary | ICD-10-CM | POA: Diagnosis not present

## 2020-04-18 DIAGNOSIS — I509 Heart failure, unspecified: Secondary | ICD-10-CM | POA: Diagnosis not present

## 2020-04-18 DIAGNOSIS — R0609 Other forms of dyspnea: Secondary | ICD-10-CM

## 2020-04-18 DIAGNOSIS — R0602 Shortness of breath: Secondary | ICD-10-CM | POA: Diagnosis present

## 2020-04-18 LAB — CBC
HCT: 44.8 % (ref 39.0–52.0)
Hemoglobin: 13.6 g/dL (ref 13.0–17.0)
MCH: 23.5 pg — ABNORMAL LOW (ref 26.0–34.0)
MCHC: 30.4 g/dL (ref 30.0–36.0)
MCV: 77.5 fL — ABNORMAL LOW (ref 80.0–100.0)
Platelets: 117 10*3/uL — ABNORMAL LOW (ref 150–400)
RBC: 5.78 MIL/uL (ref 4.22–5.81)
RDW: 15.7 % — ABNORMAL HIGH (ref 11.5–15.5)
WBC: 4.6 10*3/uL (ref 4.0–10.5)
nRBC: 0 % (ref 0.0–0.2)

## 2020-04-18 LAB — BASIC METABOLIC PANEL
Anion gap: 8 (ref 5–15)
BUN: 20 mg/dL (ref 6–20)
CO2: 25 mmol/L (ref 22–32)
Calcium: 8.7 mg/dL — ABNORMAL LOW (ref 8.9–10.3)
Chloride: 105 mmol/L (ref 98–111)
Creatinine, Ser: 1.05 mg/dL (ref 0.61–1.24)
GFR calc Af Amer: >60 mL/min (ref >60)
GFR calc non Af Amer: >60 mL/min (ref >60)
Glucose, Bld: 90 mg/dL (ref 70–99)
Potassium: 3.9 mmol/L (ref 3.5–5.1)
Sodium: 138 mmol/L (ref 135–145)

## 2020-04-18 LAB — BRAIN NATRIURETIC PEPTIDE: B Natriuretic Peptide: 596 pg/mL — ABNORMAL HIGH (ref 0.0–100.0)

## 2020-04-18 LAB — TROPONIN I (HIGH SENSITIVITY)
Troponin I (High Sensitivity): 11 ng/L (ref <18)
Troponin I (High Sensitivity): 11 ng/L (ref <18)

## 2020-04-18 MED ORDER — FUROSEMIDE 10 MG/ML IJ SOLN
20.0000 mg | Freq: Once | INTRAMUSCULAR | Status: AC
  Administered 2020-04-18: 20 mg via INTRAVENOUS
  Filled 2020-04-18: qty 2

## 2020-04-18 NOTE — ED Triage Notes (Addendum)
Pt complains of shortness of breath for the past month. Pt states he was tested for covid three wks ago and was negative. Pt does have a heart history. NAD

## 2020-04-18 NOTE — Discharge Instructions (Signed)
Contact a health care provider if: °Your condition does not improve as soon as expected. °You have a hard time doing your normal activities, even after you rest. °You have new symptoms. °Get help right away if: °Your shortness of breath gets worse. °You have shortness of breath when you are resting. °You feel light-headed or you faint. °You have a cough that is not controlled with medicines. °You cough up blood. °You have pain with breathing. °You have pain in your chest, arms, shoulders, or abdomen. °You have a fever. °You cannot walk up stairs or exercise the way that you normally do. °

## 2020-04-18 NOTE — ED Provider Notes (Signed)
[facility] EMERGENCY DEPARTMENT Provider Note   CSN: [id number] Arrival date & time: 04/18/20  1111     History Chief Complaint  Patient presents with  . Shortness of Breath    [name] [name] is a 59 y.o. male who is a past medical history of CHF, hypertension, thyroid disease, alcoholic cardiomyopathy who presents emergency department with 1 month of worsening shortness of breath.  Patient follows with [facility] cardiology.  He states that he has been trying to get in to see someone up there but they cannot get him in until the end of September.  Patient states that he has been to the ER up in [place] where he is lives and was told that he needs medication adjustments but cannot get into see his cardiologist and in the meantime is having progressively worsening shortness of breath.  He states that he is unable to lie flat.  He has a sleep with several pillows or in his hospital bed at home which lifts his thorax up.  He wakes up coughing and feels like he cannot catch his breath.  He feels like he is going to pass out and cannot breathe at all.  He has had worsening exertional dyspnea.  He states that every day he seems to be able to move less and less without getting completely winded.  He denies chest pain.  HPI     Past Medical History:  Diagnosis Date  . CHF (congestive heart failure) (HCC)   . Hypertension   . Thyroid disease     Patient Active Problem List   Diagnosis Date Noted  . 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 CONGESTIVE HEART FAILURE 09/01/2006  . GERD 09/01/2006  . CARDIAC CATHETERIZATION, LEFT, HX OF 09/01/2006  . GRAVE'S DISEASE 08/12/2006  . ALCOHOL USE 08/12/2006  . CARDIOMYOPATHY 08/12/2006    Past Surgical History:  Procedure Laterality Date  . CARDIAC DEFIBRILLATOR PLACEMENT    . PACEMAKER IMPLANT         History reviewed. No pertinent family history.  Social History   Tobacco Use  . Smoking status: Former Games developer  . Smokeless tobacco: Never  Used  Substance Use Topics  . Alcohol use: Not Currently  . Drug use: Not Currently    Home Medications Prior to Admission medications   Medication Sig Start Date End Date Taking? Authorizing Provider  aspirin EC 81 MG tablet Take 81 mg by mouth daily. Swallow whole.   Yes [provider]  atorvastatin (LIPITOR) 40 MG tablet Take 40 mg by mouth daily. 02/12/20  Yes [provider]  carvedilol (COREG) 12.5 MG tablet Take 12.5 mg by mouth 2 (two) times daily. 02/12/20  Yes [provider]  digoxin (LANOXIN) 0.125 MG tablet Take 125 mcg by mouth daily. 04/16/20  Yes [provider]  furosemide (LASIX) 20 MG tablet Take 20 mg by mouth daily. 03/07/20  Yes [provider]  levothyroxine (SYNTHROID) 150 MCG tablet Take 150 mcg by mouth daily before breakfast.   Yes [provider]  SUMAtriptan (IMITREX) 100 MG tablet Take 100 mg by mouth 2 (two) times daily as needed. 04/02/20  Yes [provider]    Allergies    Patient has no known allergies.  Review of Systems   Review of Systems  Physical Exam Updated Vital Signs BP 116/72 (BP Location: Left Arm)   Pulse 68   Temp 98.1 F (36.7 C) (Oral)   Resp 18   Ht 6' (1.829 m)   [employer]  83.5 kg   SpO2 100%   BMI 24.95 kg/m   Physical Exam Vitals and nursing note reviewed.  Constitutional:      General: He is not in acute distress.    Appearance: He is well-developed. He is not diaphoretic.  HENT:     Head: Normocephalic and atraumatic.  Eyes:     General: No scleral icterus.    Conjunctiva/sclera: Conjunctivae normal.  Cardiovascular:     Rate and Rhythm: Normal rate and regular rhythm.     Heart sounds: Normal heart sounds.  Pulmonary:     Effort: Pulmonary effort is normal. No respiratory distress.     Breath sounds: Examination of the right-lower field reveals rales. Examination of the left-lower field reveals rales. Rales present.  Abdominal:     Palpations: Abdomen is  soft.     Tenderness: There is no abdominal tenderness.  Musculoskeletal:     Cervical back: Normal range of motion and neck supple.     Right lower leg: No edema.     Left lower leg: No edema.  Skin:    General: Skin is warm and dry.  Neurological:     Mental Status: He is alert.  Psychiatric:        Behavior: Behavior normal.     ED Results / Procedures / Treatments   Labs (all labs ordered are listed, but only abnormal results are displayed) Labs Reviewed  BASIC METABOLIC PANEL - Abnormal; Notable for the following components:      Result Value   Calcium 8.7 (*)    All other components within normal limits  CBC - Abnormal; Notable for the following components:   MCV 77.5 (*)    MCH 23.5 (*)    RDW 15.7 (*)    Platelets 117 (*)    All other components within normal limits  BRAIN NATRIURETIC PEPTIDE - Abnormal; Notable for the following components:   B Natriuretic Peptide 596.0 (*)    All other components within normal limits  TROPONIN I (HIGH SENSITIVITY)  TROPONIN I (HIGH SENSITIVITY)    EKG None AV dual-paced rhythm with occasional Premature ventricular complexes at a rate of 73 Radiology DG Chest 2 View  Result Date: 04/18/2020 CLINICAL DATA:  Shortness of breath for the past month. EXAM: CHEST - 2 VIEW COMPARISON:  Radiograph 07/06/2019 FINDINGS: Multi lead left-sided pacemaker remains in place with leads projecting over the right atrium, right ventricle, and coronary sinus stable upper normal heart size. Unchanged mediastinal contours. Mild vascular congestion without pulmonary edema. No confluent airspace disease. No pneumothorax. Trace fluid in the fissures without large subpulmonic effusion. No acute osseous abnormalities are seen. IMPRESSION: Mild vascular congestion with trace fluid in the fissures. Electronically Signed   By: Narda Rutherford M.D.   On: 04/18/2020 15:33    Procedures Procedures (including critical care time)  Medications Ordered in  ED Medications  furosemide (LASIX) injection 20 mg (20 mg Intravenous Given 04/18/20 1723)    ED Course  I have reviewed the triage vital signs and the nursing notes.  Pertinent labs & imaging results that were available during my care of the patient were reviewed by me and considered in my medical decision making (see chart for details).    MDM Rules/Calculators/A&P                          CC:sob VS:  Vitals:   04/18/20 1530 04/18/20 1545 04/18/20 1600 04/18/20 1845  BP:  106/89  106/85 116/72  Pulse: 74 72 (!) 59 68  Resp:  20  18  Temp:      TempSrc:      SpO2: 99% 94% 95% 100%  Weight:      Height:        EL:FYBOFBP is gathered by patient. Previous records obtained and reviewed. DDX:The patient's complaint of sob involves an extensive number of diagnostic and treatment options, and is a complaint that carries with it a high risk of complications, morbidity, and potential mortality. Given the large differential diagnosis, medical decision making is of high complexity. The emergent differential diagnosis for shortness of breath includes, but is not limited to, Pulmonary edema, bronchoconstriction, Pneumonia, Pulmonary embolism, Pneumotherax/ Hemothorax, Dysrythmia, ACS.   Labs: I ordered reviewed and interpreted labs which included: CBC-with mild thrombocytopenia BNP mildly elevated  Troponin I negative x2 BMP without sig abnormality    Imaging: I ordered and reviewed images which included 2 v cxr. I independently visualized and interpreted all imaging. Significant findings include mild pulmonary vascular congestion .  EKG:AV dual-paced rhythm with occasional Premature ventricular complexes, rate of 73 Consults: ZWC:HENIDPO here with orthopnea and exertional dyspnea worsening over 1 month. Patient given IV lasix and put out 1L of urine and feels markedly improved. Discussed daily weight checks, proper medication usage, close OP follow up and return precautions.  Patient  disposition:discharge The patient appears reasonably screened and/or stabilized for discharge and I doubt any other medical condition or other [facility] requiring further screening, evaluation, or treatment in the ED at this time prior to discharge. I have discussed lab and/or imaging findings with the patient and answered all questions/concerns to the best of my ability.I have discussed return precautions and OP follow up.  TYRICE HEWITT was evaluated in Emergency Department on 04/19/2020 for the symptoms described in the history of present illness. He was evaluated in the context of the global COVID-19 pandemic, which necessitated consideration that the patient might be at risk for infection with the SARS-CoV-2 virus that causes COVID-19. Institutional protocols and algorithms that pertain to the evaluation of patients at risk for COVID-19 are in a state of rapid change based on information released by regulatory bodies including the CDC and federal and state organizations. These policies and algorithms were followed during the patient's care in the ED.   Final Clinical Impression(s) / ED Diagnoses Final diagnoses:  Exertional dyspnea    Rx / DC Orders ED Discharge Orders    None       Arthor Captain, PA-C 04/19/20 2136    Maia Plan, MD 04/24/20 [phone]

## 2022-01-01 ENCOUNTER — Encounter (HOSPITAL_COMMUNITY): Payer: Self-pay

## 2022-01-01 ENCOUNTER — Other Ambulatory Visit: Payer: Self-pay

## 2022-01-01 ENCOUNTER — Emergency Department (HOSPITAL_COMMUNITY): Payer: Medicare HMO

## 2022-01-01 ENCOUNTER — Emergency Department (HOSPITAL_COMMUNITY)
Admission: EM | Admit: 2022-01-01 | Discharge: 2022-01-01 | Disposition: A | Discharge status: Home / Self Care | Payer: Medicare HMO | Attending: Emergency Medicine | Admitting: Emergency Medicine

## 2022-01-01 DIAGNOSIS — D72819 Decreased white blood cell count, unspecified: Secondary | ICD-10-CM | POA: Diagnosis not present

## 2022-01-01 DIAGNOSIS — Z7982 Long term (current) use of aspirin: Secondary | ICD-10-CM | POA: Diagnosis not present

## 2022-01-01 DIAGNOSIS — R0602 Shortness of breath: Secondary | ICD-10-CM | POA: Insufficient documentation

## 2022-01-01 DIAGNOSIS — R079 Chest pain, unspecified: Secondary | ICD-10-CM | POA: Diagnosis not present

## 2022-01-01 DIAGNOSIS — R7989 Other specified abnormal findings of blood chemistry: Secondary | ICD-10-CM | POA: Diagnosis not present

## 2022-01-01 LAB — CBC WITH DIFFERENTIAL/PLATELET
Abs Immature Granulocytes: 0.01 10*3/uL (ref 0.00–0.07)
Basophils Absolute: 0.1 10*3/uL (ref 0.0–0.1)
Basophils Relative: 2 %
Eosinophils Absolute: 0.2 10*3/uL (ref 0.0–0.5)
Eosinophils Relative: 4 %
HCT: 41.9 % (ref 39.0–52.0)
Hemoglobin: 13.2 g/dL (ref 13.0–17.0)
Immature Granulocytes: 0 %
Lymphocytes Relative: 36 %
Lymphs Abs: 1.4 10*3/uL (ref 0.7–4.0)
MCH: 24.2 pg — ABNORMAL LOW (ref 26.0–34.0)
MCHC: 31.5 g/dL (ref 30.0–36.0)
MCV: 76.9 fL — ABNORMAL LOW (ref 80.0–100.0)
Monocytes Absolute: 0.4 10*3/uL (ref 0.1–1.0)
Monocytes Relative: 10 %
Neutro Abs: 1.9 10*3/uL (ref 1.7–7.7)
Neutrophils Relative %: 48 %
Platelets: 125 10*3/uL — ABNORMAL LOW (ref 150–400)
RBC: 5.45 MIL/uL (ref 4.22–5.81)
RDW: 16.2 % — ABNORMAL HIGH (ref 11.5–15.5)
WBC: 3.9 10*3/uL — ABNORMAL LOW (ref 4.0–10.5)
nRBC: 0 % (ref 0.0–0.2)

## 2022-01-01 LAB — COMPREHENSIVE METABOLIC PANEL
ALT: 21 U/L (ref 0–44)
AST: 24 U/L (ref 15–41)
Albumin: 4.1 g/dL (ref 3.5–5.0)
Alkaline Phosphatase: 47 U/L (ref 38–126)
Anion gap: 7 (ref 5–15)
BUN: 21 mg/dL — ABNORMAL HIGH (ref 6–20)
CO2: 26 mmol/L (ref 22–32)
Calcium: 8.7 mg/dL — ABNORMAL LOW (ref 8.9–10.3)
Chloride: 101 mmol/L (ref 98–111)
Creatinine, Ser: 1.21 mg/dL (ref 0.61–1.24)
GFR, Estimated: >60 mL/min (ref >60)
Glucose, Bld: 126 mg/dL — ABNORMAL HIGH (ref 70–99)
Potassium: 4.5 mmol/L (ref 3.5–5.1)
Sodium: 134 mmol/L — ABNORMAL LOW (ref 135–145)
Total Bilirubin: 1.3 mg/dL — ABNORMAL HIGH (ref 0.3–1.2)
Total Protein: 7.7 g/dL (ref 6.5–8.1)

## 2022-01-01 LAB — BRAIN NATRIURETIC PEPTIDE: B Natriuretic Peptide: 523 pg/mL — ABNORMAL HIGH (ref 0.0–100.0)

## 2022-01-01 MED ORDER — FUROSEMIDE 10 MG/ML IJ SOLN
40.0000 mg | Freq: Once | INTRAMUSCULAR | Status: AC
  Administered 2022-01-01: 40 mg via INTRAVENOUS
  Filled 2022-01-01: qty 4

## 2022-01-01 MED ORDER — FUROSEMIDE 20 MG PO TABS
40.0000 mg | ORAL_TABLET | Freq: Every day | ORAL | 0 refills | Status: DC
Start: 2022-01-01 — End: 2022-01-01

## 2022-01-01 MED ORDER — TORSEMIDE 20 MG PO TABS: 40.0000 mg | ORAL_TABLET | Freq: Every day | ORAL | 0 refills | Status: AC

## 2022-01-01 NOTE — ED Notes (Signed)
Pharmacy tech called at this time for med req per EDP. ?

## 2022-01-01 NOTE — Discharge Instructions (Signed)
As discussed, today's evaluation has been generally reassuring.  There is some evidence that your body is retaining too much fluid.  Thus, you are starting an increased dose of your Lasix.  Please take this until you have seen your physician/cardiologist in about 1 week.  Return here for concerning changes in your condition. ?

## 2022-01-01 NOTE — ED Triage Notes (Signed)
Patient arrived with complaints of SOB and chest pain x2-3 days. Patient states he has been unable to lay flat due to SOB. Hx of CHF. Pt has pacemaker and ICD. ?

## 2022-01-01 NOTE — ED Provider Notes (Addendum)
?[facility] ?Provider Note ? ? ?CSN: [id number] ?Arrival date & time: 01/01/22  6195 ? ?  ? ?History ? ?Chief Complaint  ?Patient presents with  ? Chest Pain  ? Shortness of Breath  ? ? ?[name] [name] is a 61 y.o. male. ? 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  Symptoms are worse with supine positioning, better throughout the day, no associated lower extremity edema.  He has been taking his medication as directed.  Chest pain is inconsistent, seemingly worse when dyspnea is more present. ?  ? ?Home Medications ?Prior to Admission medications   ?Medication Sig Start Date End Date Taking? Authorizing Provider  ?aspirin EC 81 MG tablet Take 81 mg by mouth daily. Swallow whole.    [provider]  ?atorvastatin (LIPITOR) 40 MG tablet Take 40 mg by mouth daily. 02/12/20   [provider]  ?carvedilol (COREG) 12.5 MG tablet Take 12.5 mg by mouth 2 (two) times daily. 02/12/20   [provider]  ?digoxin (LANOXIN) 0.125 MG tablet Take 125 mcg by mouth daily. 04/16/20   [provider]  ?furosemide (LASIX) 20 MG tablet Take 2 tablets (40 mg total) by mouth daily for 7 days. 01/01/22 01/08/22  Gerhard Munch, MD  ?levothyroxine (SYNTHROID) 150 MCG tablet Take 150 mcg by mouth daily before breakfast.    [provider]  ?SUMAtriptan (IMITREX) 100 MG tablet Take 100 mg by mouth 2 (two) times daily as needed. 04/02/20   [provider]  ?   ? ?Allergies    ?Patient has no known allergies.   ? ?Review of Systems   ?Review of Systems  ?Constitutional:   ?     Per HPI, otherwise negative  ?HENT:    ?     Per HPI, otherwise negative  ?Respiratory:    ?     Per HPI, otherwise negative  ?Cardiovascular:   ?     Per HPI, otherwise negative  ?Gastrointestinal:  Negative for vomiting.  ?Endocrine:  ?     Negative aside from HPI  ?Genitourinary:   ?     Neg aside from HPI   ?Musculoskeletal:   ?     Per HPI, otherwise  negative  ?Skin: Negative.   ?Neurological:  Negative for syncope.  ? ?Physical Exam ?Updated Vital Signs ?BP 93/73   Pulse 75   Resp 13   Ht 6' (1.829 m)   Wt 81.6 kg   SpO2 96%   BMI 24.41 kg/m?  ?Physical Exam ?Vitals and nursing note reviewed.  ?Constitutional:   ?   General: He is not in acute distress. ?   Appearance: He is well-developed.  ?HENT:  ?   Head: Normocephalic and atraumatic.  ?Eyes:  ?   Conjunctiva/sclera: Conjunctivae normal.  ?Cardiovascular:  ?   Rate and Rhythm: Normal rate and regular rhythm.  ?Pulmonary:  ?   Effort: Pulmonary effort is normal. No respiratory distress.  ?   Breath sounds: No stridor.  ?Abdominal:  ?   General: There is no distension.  ?Skin: ?   General: Skin is warm and dry.  ?Neurological:  ?   Mental Status: He is alert and oriented to person, place, and time.  ? ? ?ED Results / Procedures / Treatments   ?Labs ?(all labs ordered are listed, but only abnormal results are displayed) ?Labs Reviewed  ?COMPREHENSIVE METABOLIC PANEL - Abnormal; Notable for the following components:  ?  Result Value  ? Sodium 134 (*)   ? Glucose, Bld 126 (*)   ? BUN 21 (*)   ? Calcium 8.7 (*)   ? Total Bilirubin 1.3 (*)   ? All other components within normal limits  ?CBC WITH DIFFERENTIAL/PLATELET - Abnormal; Notable for the following components:  ? WBC 3.9 (*)   ? MCV 76.9 (*)   ? MCH 24.2 (*)   ? RDW 16.2 (*)   ? Platelets 125 (*)   ? All other components within normal limits  ?BRAIN NATRIURETIC PEPTIDE - Abnormal; Notable for the following components:  ? B Natriuretic Peptide 523.0 (*)   ? All other components within normal limits  ? ? ?EKG ?EKG Interpretation ? ?Date/Time:  Tuesday Jan 01 2022 08:34:42 EDT ?Ventricular Rate:  84 ?PR Interval:  183 ?QRS Duration: 148 ?QT Interval:  405 ?QTC Calculation: 465 ?R Axis:   138 ?Text Interpretation: Sinus rhythm Multiform ventricular premature complexes Left atrial enlargement Right bundle branch block Probable anterolateral infarct, age  indeterm Baseline wander in lead(s) II III aVF Abnormal ECG Confirmed by Gerhard Munch [phone]) on 01/01/2022 9:02:48 AM ? ?Radiology ?DG Chest Port 1 View ? ?Result Date: 01/01/2022 ?CLINICAL DATA:  Shortness of breath and cough. EXAM: PORTABLE CHEST 1 VIEW COMPARISON:  04/18/2020 FINDINGS: 0844 hours. The cardio pericardial silhouette is enlarged. The lungs are clear without focal pneumonia, edema, pneumothorax or pleural effusion. Left-sided pacer/AICD again noted. Telemetry leads overlie the chest. IMPRESSION: Stable exam. No acute cardiopulmonary findings. Electronically Signed   By: Kennith Center M.D.   On: 01/01/2022 08:59   ? ?Procedures ?Procedures  ? ? ?Medications Ordered in ED ?Medications  ?furosemide (LASIX) injection 40 mg (has no administration in time range)  ? ? ?ED Course/ Medical Decision Making/ A&P ?This patient with a Hx of heart failure, arrhythmia, presents to the ED for concern of dyspnea, chest pain, this involves an extensive number of treatment options, and is a complaint that carries with it a high risk of complications and morbidity.   ? ?The differential diagnosis includes heart failure exacerbation, atypical ACS, electrolyte abnormalities, bacteremia, sepsis, pneumonia ? ? ?Social Determinants of Health: ? ?Medical complexity ? ?Additional history obtained: ? ?Additional history and/or information obtained from chart review, notable for ongoing monitoring of his pacemaker with cardiology in [place] ? ? ?After the initial evaluation, orders, including: Labs x-ray continuous monitoring were initiated. ? ? ?Patient placed on Cardiac and Pulse-Oximetry Monitors. ?The patient was maintained on a cardiac monitor.  The cardiac monitored showed an rhythm of 75 sinus normal ?The patient was also maintained on pulse oximetry. The readings were typically 100% room air normal ? ? ?On repeat evaluation of the patient improved ? ?Lab Tests: ? ?I personally interpreted labs.  The pertinent results  include: Mild leukopenia, elevated BNP ? ?Imaging Studies ordered: ? ?I independently visualized and interpreted imaging which showed no substantial abnormalities ?I agree with the radiologist interpretation ? ? ?Dispostion / Final MDM: ? ?After consideration of the diagnostic results and the patient's response to treatment, patient received IV Lasix here, has no new oxygen requirement, no evidence for pneumonia, bacteremia, sepsis.  We discussed implications of elevated BNP, and his orthopnea.  Patient will have increased Lasix dosing, will follow-up with cardiology as an outpatient. ? ? ?There was a slight delay in discharge as the patient's medications were requiring additional reconciliation. ?Final Clinical Impression(s) / ED Diagnoses ?Final diagnoses:  ?Shortness of breath  ? ? ?Rx / DC  Orders ?ED Discharge Orders   ? ?      Ordered  ?     ?  torsemide (DEMADEX) 20 MG tablet  Daily       ? 01/01/22 1104  ? ?  ?  ? ?  ? ? ?  ?Gerhard MunchLockwood, Myan Suit, MD ?01/01/22 [phone] ? ?  ?Gerhard MunchLockwood, Tareek Sabo, MD ?01/01/22 1104 ? ?

## 2022-08-22 IMAGING — DX DG CHEST 2V
2 series · 2 of 2 positions shown · non-contrast
Comparison: Radiograph 07/06/2019

CLINICAL DATA: Shortness of breath for the past month.

EXAM:
CHEST - 2 VIEW

[chest pa]
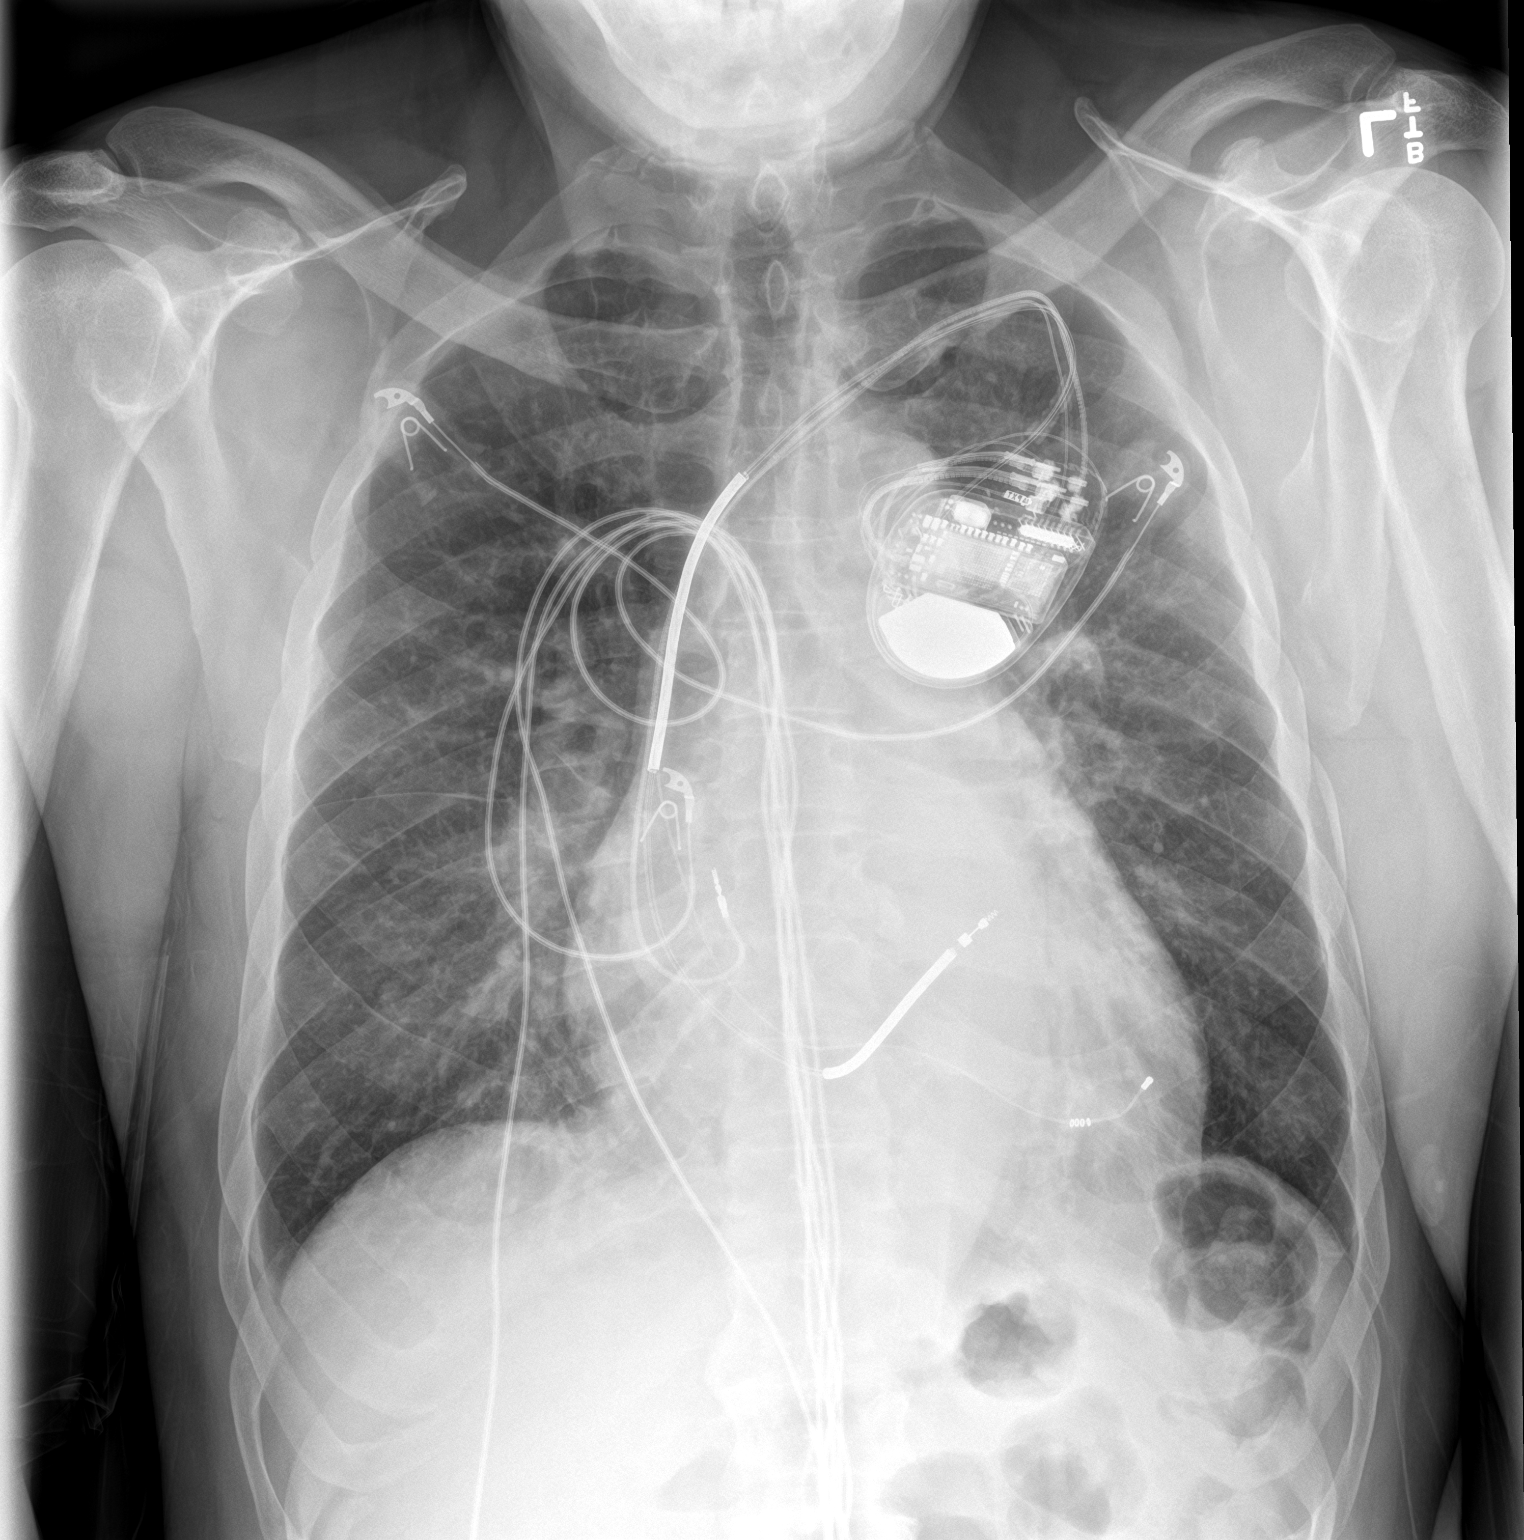

[chest lat]
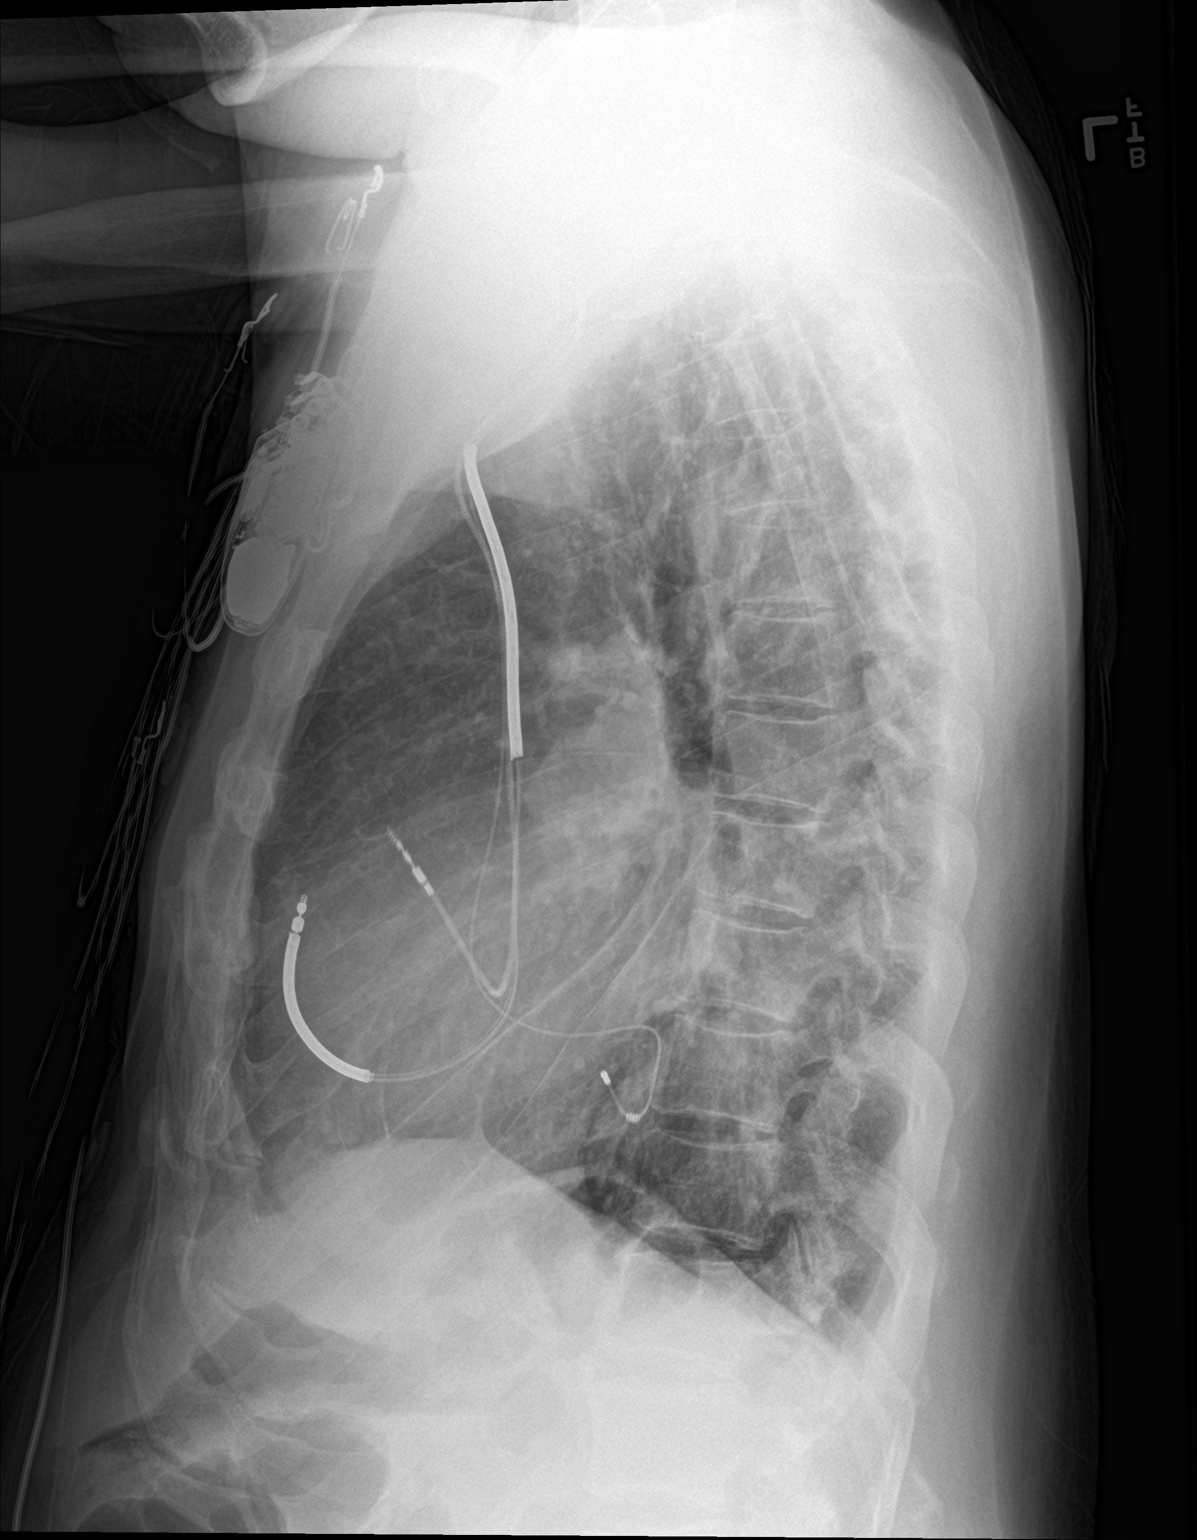

[2 of 2 positions shown; findings below may reference images not displayed]

FINDINGS: Multi lead left-sided pacemaker remains in place with leads
projecting over the right atrium, right ventricle, and coronary
sinus stable upper normal heart size. Unchanged mediastinal
contours. Mild vascular congestion without pulmonary edema. No
confluent airspace disease. No pneumothorax. Trace fluid in the
fissures without large subpulmonic effusion. No acute osseous
abnormalities are seen.
IMPRESSION: Mild vascular congestion with trace fluid in the fissures.

## 2023-09-24 LAB — TSH: TSH: 32.6 — AB (ref 0.41–5.90)

## 2023-10-23 LAB — TSH: TSH: 36.44 — AB (ref 0.41–5.90)

## 2023-12-19 LAB — TSH: TSH: 1.29 (ref 0.41–5.90)

## 2024-02-10 ENCOUNTER — Ambulatory Visit: Payer: Self-pay | Admitting: Nurse Practitioner

## 2024-05-18 NOTE — Patient Instructions (Signed)

## 2024-05-20 ENCOUNTER — Encounter: Admitting: Nurse Practitioner

## 2024-05-20 DIAGNOSIS — E039 Hypothyroidism, unspecified: Secondary | ICD-10-CM

## 2024-05-20 NOTE — Progress Notes (Unsigned)
 Erroneous encounter
